# Patient Record
Sex: Male | Born: 1970 | Hispanic: Yes | Marital: Single | Smoking: Never smoker
Health system: Southern US, Community
[De-identification: ages and names within clinical notes are randomized; demographics above are authoritative.]

## PROBLEM LIST (undated history)

## (undated) DIAGNOSIS — F419 Anxiety disorder, unspecified: Secondary | ICD-10-CM

## (undated) HISTORY — PX: ABDOMINAL SURGERY: SHX537

---

## 2010-01-06 ENCOUNTER — Ambulatory Visit: Payer: Self-pay | Admitting: Family Medicine

## 2013-07-06 ENCOUNTER — Ambulatory Visit: Payer: Self-pay

## 2013-08-03 ENCOUNTER — Encounter: Payer: Self-pay | Admitting: *Deleted

## 2013-08-24 ENCOUNTER — Ambulatory Visit: Payer: Self-pay | Admitting: General Surgery

## 2013-09-07 ENCOUNTER — Encounter: Payer: Self-pay | Admitting: *Deleted

## 2013-11-13 ENCOUNTER — Emergency Department: Payer: Self-pay | Admitting: Emergency Medicine

## 2013-11-13 LAB — LIPASE, BLOOD: LIPASE: 313 U/L (ref 73–393)

## 2013-11-13 LAB — CBC WITH DIFFERENTIAL/PLATELET
BASOS ABS: 0.1 10*3/uL (ref 0.0–0.1)
BASOS PCT: 1 %
Eosinophil #: 0.2 10*3/uL (ref 0.0–0.7)
Eosinophil %: 3.4 %
HCT: 45.9 % (ref 40.0–52.0)
HGB: 15.6 g/dL (ref 13.0–18.0)
LYMPHS PCT: 17 %
Lymphocyte #: 1.2 10*3/uL (ref 1.0–3.6)
MCH: 34.2 pg — ABNORMAL HIGH (ref 26.0–34.0)
MCHC: 34.1 g/dL (ref 32.0–36.0)
MCV: 101 fL — AB (ref 80–100)
MONO ABS: 0.9 x10 3/mm (ref 0.2–1.0)
MONOS PCT: 12.1 %
Neutrophil #: 4.8 10*3/uL (ref 1.4–6.5)
Neutrophil %: 66.5 %
Platelet: 116 10*3/uL — ABNORMAL LOW (ref 150–440)
RBC: 4.57 10*6/uL (ref 4.40–5.90)
RDW: 13.2 % (ref 11.5–14.5)
WBC: 7.2 10*3/uL (ref 3.8–10.6)

## 2013-11-13 LAB — COMPREHENSIVE METABOLIC PANEL
ANION GAP: 7 (ref 7–16)
AST: 135 U/L — AB (ref 15–37)
Albumin: 3.6 g/dL (ref 3.4–5.0)
Alkaline Phosphatase: 97 U/L
BUN: 6 mg/dL — ABNORMAL LOW (ref 7–18)
Bilirubin,Total: 2.1 mg/dL — ABNORMAL HIGH (ref 0.2–1.0)
CHLORIDE: 104 mmol/L (ref 98–107)
Calcium, Total: 8.4 mg/dL — ABNORMAL LOW (ref 8.5–10.1)
Co2: 26 mmol/L (ref 21–32)
Creatinine: 0.64 mg/dL (ref 0.60–1.30)
EGFR (African American): >60
EGFR (Non-African Amer.): >60
Glucose: 164 mg/dL — ABNORMAL HIGH (ref 65–99)
Osmolality: 275 (ref 275–301)
Potassium: 3.5 mmol/L (ref 3.5–5.1)
SGPT (ALT): 80 U/L — ABNORMAL HIGH (ref 12–78)
SODIUM: 137 mmol/L (ref 136–145)
Total Protein: 8.1 g/dL (ref 6.4–8.2)

## 2013-11-13 LAB — TROPONIN I: Troponin-I: <0.02 ng/mL

## 2013-11-14 LAB — TROPONIN I

## 2016-01-28 ENCOUNTER — Emergency Department
Admission: EM | Admit: 2016-01-28 | Discharge: 2016-01-28 | Disposition: A | Discharge status: Home / Self Care | Payer: Self-pay | Attending: Emergency Medicine | Admitting: Emergency Medicine

## 2016-01-28 ENCOUNTER — Emergency Department: Payer: Self-pay

## 2016-01-28 ENCOUNTER — Encounter: Payer: Self-pay | Admitting: Emergency Medicine

## 2016-01-28 ENCOUNTER — Ambulatory Visit
Admission: EM | Admit: 2016-01-28 | Discharge: 2016-01-28 | Disposition: A | Discharge status: Other Acute Inpatient Hospital | Payer: Self-pay | Attending: Family Medicine | Admitting: Family Medicine

## 2016-01-28 DIAGNOSIS — F1010 Alcohol abuse, uncomplicated: Secondary | ICD-10-CM | POA: Insufficient documentation

## 2016-01-28 DIAGNOSIS — R202 Paresthesia of skin: Secondary | ICD-10-CM | POA: Insufficient documentation

## 2016-01-28 DIAGNOSIS — R208 Other disturbances of skin sensation: Secondary | ICD-10-CM

## 2016-01-28 DIAGNOSIS — R200 Anesthesia of skin: Secondary | ICD-10-CM

## 2016-01-28 DIAGNOSIS — Z789 Other specified health status: Secondary | ICD-10-CM

## 2016-01-28 DIAGNOSIS — Z7289 Other problems related to lifestyle: Secondary | ICD-10-CM

## 2016-01-28 HISTORY — DX: Anxiety disorder, unspecified: F41.9

## 2016-01-28 LAB — COMPREHENSIVE METABOLIC PANEL
ALT: 44 U/L (ref 17–63)
AST: 183 U/L — AB (ref 15–41)
Albumin: 2.7 g/dL — ABNORMAL LOW (ref 3.5–5.0)
Alkaline Phosphatase: 114 U/L (ref 38–126)
Anion gap: 8 (ref 5–15)
BUN: 8 mg/dL (ref 6–20)
CHLORIDE: 105 mmol/L (ref 101–111)
CO2: 25 mmol/L (ref 22–32)
CREATININE: 0.59 mg/dL — AB (ref 0.61–1.24)
Calcium: 8.1 mg/dL — ABNORMAL LOW (ref 8.9–10.3)
GFR calc non Af Amer: >60 mL/min (ref >60)
Glucose, Bld: 107 mg/dL — ABNORMAL HIGH (ref 65–99)
Potassium: 3.8 mmol/L (ref 3.5–5.1)
SODIUM: 138 mmol/L (ref 135–145)
Total Bilirubin: 5.1 mg/dL — ABNORMAL HIGH (ref 0.3–1.2)
Total Protein: 7.6 g/dL (ref 6.5–8.1)

## 2016-01-28 LAB — CBC
HCT: 38.8 % — ABNORMAL LOW (ref 40.0–52.0)
Hemoglobin: 13.5 g/dL (ref 13.0–18.0)
MCH: 36 pg — AB (ref 26.0–34.0)
MCHC: 34.8 g/dL (ref 32.0–36.0)
MCV: 103.3 fL — ABNORMAL HIGH (ref 80.0–100.0)
PLATELETS: 74 10*3/uL — AB (ref 150–440)
RBC: 3.75 MIL/uL — AB (ref 4.40–5.90)
RDW: 15.1 % — ABNORMAL HIGH (ref 11.5–14.5)
WBC: 4.6 10*3/uL (ref 3.8–10.6)

## 2016-01-28 LAB — LIPASE, BLOOD: Lipase: 51 U/L (ref 11–51)

## 2016-01-28 LAB — BILIRUBIN, DIRECT: Bilirubin, Direct: 2.2 mg/dL — ABNORMAL HIGH (ref 0.1–0.5)

## 2016-01-28 LAB — TROPONIN I

## 2016-01-28 MED ORDER — ASPIRIN 81 MG PO CHEW
324.0000 mg | CHEWABLE_TABLET | Freq: Once | ORAL | Status: AC
  Administered 2016-01-28: 324 mg via ORAL

## 2016-01-28 MED ORDER — SODIUM CHLORIDE 0.9 % IV BOLUS (SEPSIS)
1000.0000 mL | Freq: Once | INTRAVENOUS | Status: AC
  Administered 2016-01-28: 1000 mL via INTRAVENOUS

## 2016-01-28 NOTE — ED Provider Notes (Signed)
CSN: [id number]     Arrival date & time 01/28/16  0935 History   First MD Initiated Contact with Patient 01/28/16 [phone]     Chief Complaint  Patient presents with  . Numbness    left side of face and arm    (Consider location/radiation/quality/duration/timing/severity/associated sxs/prior Treatment) HPI: Patient presents today with symptoms of left-sided facial numbness and left arm numbness and weakness. Patient states that his symptoms started approximately 3 hours ago. He felt generalized weakness yesterday after cutting the lawn. He denies any headache, chest pain or shortness of breath or diaphoresis. Patient is to some vision changes that he has noticed over the last 2 months. He denies any history of hypertension, MI, CVA/TIA. He denies taking any medications recently. He does state that he did use marijuana yesterday. He denies any other drug use. He denies smoking history.  No past medical history on file. Past Surgical History  Procedure Laterality Date  . Abdominal surgery     No family history on file. Social History  Substance Use Topics  . Smoking status: Never Smoker   . Smokeless tobacco: Never Used  . Alcohol Use: Yes    Review of Systems: Negative except mentioned above.   Allergies  Review of patient's allergies indicates no known allergies.  Home Medications   Prior to Admission medications   Not on File   Meds Ordered and Administered this Visit   Medications  aspirin chewable tablet 324 mg (324 mg Oral Given 01/28/16 0954)    BP 147/85 mmHg  Pulse 91  Temp(Src) 98 F (36.7 C) (Oral)  Resp 18  Ht 5\' 6"  (1.676 m)  Wt 188 lb (85.276 kg)  BMI 30.36 kg/m2  SpO2 98% No data found.   Physical Exam   GENERAL: NAD HEENT: no pharyngeal erythema, no exudate, no erythema of TMs, no cervical LAD RESP: CTA B CARD: RRR NEURO: Patient admits to decreased sensation on the left side of the face, no obvious facial droop or tongue deviation or slurred speech,  no weakness appreciated with extremities   ED Course  Procedures (including critical care time)  Labs Review Labs Reviewed - No data to display  Imaging Review No results found.    MDM   1. Left facial numbness   EKG shows normal sinus rhythm, no ST elevations or depressions noted, normal P waves. Patient given 4 baby aspirin in the office. Will send patient to ER for further evaluation and treatment by EMS.     Jolene ProvostKirtida Kiela Shisler, MD 01/28/16 1002

## 2016-01-28 NOTE — ED Provider Notes (Signed)
[facility] Emergency Department Provider Note  Time seen: 11:15 AM  I have reviewed the triage vital signs and the nursing notes.   HISTORY  Chief Complaint Weakness and Numbness    HPI Daniel Walton is a 45 y.o. male with no past medical history presents to the emergency department with generalized weakness and left facial tingling. According to the patient he was doing a lot of yard work yesterday, states after doing yard work he had a generalized feeling of fatigue and weakness. Today he states the weakness has continued and he felt a tingling sensation to his left face with vomiting times one and so he went to urgent care for evaluation. Contrary to the urgent care note the patient denies any weakness, numbness or tingling of any arm or leg, confusion or slurred speech. Patient states just a tingling sensation to his left temple and left cheek. Denies any fever. Denies abdominal pain or chest pain.Patient continues to feel a slight tingling sensation to his left face is his only medical complaint.     Past Medical History  Diagnosis Date  . Anxiety     There are no active problems to display for this patient.   Past Surgical History  Procedure Laterality Date  . Abdominal surgery      No current outpatient prescriptions on file.  Allergies Review of patient's allergies indicates no known allergies.  No family history on file.  Social History Social History  Substance Use Topics  . Smoking status: Never Smoker   . Smokeless tobacco: Never Used  . Alcohol Use: Yes    Review of Systems Constitutional: Negative for fever Cardiovascular: Negative for chest pain. Respiratory: Negative for shortness of breath. Gastrointestinal: Negative for abdominal pain Musculoskeletal: Negative for back pain. Neurological: Negative for . Denies focal weakness. States the tingling sensation to left face. 10-point ROS otherwise  negative.  ____________________________________________   PHYSICAL EXAM:  VITAL SIGNS: ED Triage Vitals  Enc Vitals Group     BP 01/28/16 1033 148/89 mmHg     Pulse Rate 01/28/16 1033 85     Resp 01/28/16 1033 18     Temp 01/28/16 1033 98.4 F (36.9 C)     Temp Source 01/28/16 1033 Oral     SpO2 01/28/16 1033 100 %     Weight 01/28/16 1033 188 lb (85.276 kg)     Height 01/28/16 1033 5\' 6"  (1.676 m)     Head Cir --      Peak Flow --      Pain Score 01/28/16 1034 0     Pain Loc --      Pain Edu? --      Excl. in GC? --     Constitutional: Alert and oriented. Well appearing and in no distress. Eyes: Appears to have mild scleral icterus. ENT   Head: Normocephalic and atraumatic.   Mouth/Throat: Mucous membranes are moist. Cardiovascular: Normal rate, regular rhythm. No murmur Respiratory: Normal respiratory effort without tachypnea nor retractions. Breath sounds are clear Gastrointestinal: Soft and nontender. No distention. Musculoskeletal: Nontender with normal range of motion in all extremities. No lower extremity tenderness or edema. Neurologic:  Normal speech and language. No gross focal neurologic deficits. Equal grip strengths bilaterally. 5/5 motor in all extremities. Sensation intact and equal in all extremities. No pronator drift. No cranial nerve deficits. Skin:  Skin is warm, dry and intact.  Psychiatric: Mood and affect are normal. Speech and behavior are normal.   ____________________________________________  EKG  EKG reviewed and interpreted by myself shows normal sinus rhythm at 80 bpm, narrow QRS, normal axis, normal intervals, no ST changes. Overall normal EKG.  ____________________________________________    RADIOLOGY  CT negative.  ____________________________________________    INITIAL IMPRESSION / ASSESSMENT AND PLAN / ED COURSE  Pertinent labs & imaging results that were available during my care of the patient were reviewed by me  and considered in my medical decision making (see chart for details).  Patient presents with symptoms of generalized weakness, vomiting 1 at home earlier today. Has a sensation of tingling to his left face. Neurologic exam is normal. Patient does appear to have mild scleral icterus on exam. We will check labs including LFTs. Patient denies any medical history.  CT the head is negative. Patient's labs show an elevated bilirubin level of 5.1. I discussed this with the patient given his AST elevation as well, consistent with alcoholic hepatitis, the patient states significant alcohol use daily. I discussed with the patient the need to decrease and stop his alcohol consumption as well as following up with her primary care physician who can monitor his liver function over the next 6 months. Patient is agreeable to this plan. I discussed strict return precautions for any weakness, numbness, confusion or slurred speech. Currently the patient appears well, has no complaints at this time with a normal neurologic exam and otherwise negative workup.  ____________________________________________   FINAL CLINICAL IMPRESSION(S) / ED DIAGNOSES  Generalized weakness Paresthesia Hyperbilirubinemia Alcohol abuse  Minna AntisKevin Zakyria Metzinger, MD 01/28/16 1307

## 2016-01-28 NOTE — ED Notes (Signed)
EMS called to transport patient to [facility] ED 

## 2016-01-28 NOTE — ED Notes (Signed)
Patient complaining of left sided numbness and weakness since last night. Equal strength in hands.

## 2016-01-28 NOTE — Discharge Instructions (Signed)
Jaundice, Adult Jaundice is a yellowish discoloration of the skin, the whites of the eyes, and mucous membranes. Jaundice can be a sign that the liver or the bile system is not working normally. CAUSES This condition is caused by an increased level of bilirubin in the blood. Bilirubin is a substance that is produced by the normal breakdown of red blood cells. Conditions and activities that can cause an increase in the bilirubin level include:  Viral hepatitis.  Gallstones or other conditions, such as a tumor, that can cause a blockage of bile ducts.  Excessive use of alcohol.  Other liver diseases, such as cirrhosis.  Certain cancers.  Certain infections.  Certain genetic syndromes.  Certain medicines. SYMPTOMS Symptoms of this condition include:  Yellow color to the skin, the whites of the eyes, or mucous membranes.  Dark brown urine.  Stomach pain.  Light or clay-colored stool.  Itchy skin (pruritus). DIAGNOSIS This condition is diagnosed with a medical history, physical exam, and blood tests. You may have additional tests to determine what is causing your bilirubin level to increase. TREATMENT Treatment for jaundice depends on the underlying condition. Treatment may include:  Stopping the use of a certain medicine.  Fluids that are given through an IV tube that is inserted into one of your veins.  Medicines to treat pruritus.  Surgery, if there is blockage of the bile ducts. HOME CARE INSTRUCTIONS  Drink enough fluid to keep your urine clear or pale yellow.  Do not drink alcohol.  Take medicines only as directed by your health care provider.  Keep all follow-up visits as directed by your health care provider. This is important.  You may use skin lotion to relieve itching. SEEK MEDICAL CARE IF:  You have a fever. SEEK IMMEDIATE MEDICAL CARE IF:  Your symptoms suddenly get worse.  You have symptoms for more than 72 hours.  Your pain gets worse.  You  vomit repeatedly.  You become weak or confused.  You develop a severe headache.  You become severely dehydrated. Signs of severe dehydration include:  A very dry mouth.  A rapid, weak pulse.  Rapid breathing.  Blue lips.   This information is not intended to replace advice given to you by your health care provider. Make sure you discuss any questions you have with your health care provider.   Document Released: 07/13/2005 Document Revised: 11/27/2014 Document Reviewed: 07/09/2014 Elsevier Interactive Patient Education 2016 Elsevier Inc.  Paresthesia Paresthesia is a burning or prickling feeling. This feeling can happen in any part of the body. It often happens in the hands, arms, legs, or feet. Usually, it is not painful. In most cases, the feeling goes away in a short time and is not a sign of a serious problem. HOME CARE  Avoid drinking alcohol.  Try massage or needle therapy (acupuncture) to help with your problems.  Keep all follow-up visits as told by your doctor. This is important. GET HELP IF:  You keep on having episodes of paresthesia.  Your burning or prickling feeling gets worse when you walk.  You have pain or cramps.  You feel dizzy.  You have a rash. GET HELP RIGHT AWAY IF:  You feel weak.  You have trouble walking or moving.  You have problems speaking, understanding, or seeing.  You feel confused.  You cannot control when you pee (urinate) or poop (bowel movement).  You lose feeling (numbness) after an injury.  You pass out (faint).   This information is not  intended to replace advice given to you by your health care provider. Make sure you discuss any questions you have with your health care provider.   Document Released: 06/25/2008 Document Revised: 11/27/2014 Document Reviewed: 07/09/2014 Elsevier Interactive Patient Education [employer].

## 2016-01-28 NOTE — ED Notes (Signed)
[facility]. Pt reports working out in the yard yesterday and feeling very weak. Pt went to urgent care this morning because he continued to feel weak, experienced numbness and tingling on the left side of his face and vomited once. EMS reports negative stroke screen, 146/86 and CBG 104.

## 2016-04-22 ENCOUNTER — Ambulatory Visit: Payer: Self-pay | Admitting: Family Medicine

## 2016-11-17 ENCOUNTER — Ambulatory Visit
Admission: EM | Admit: 2016-11-17 | Discharge: 2016-11-17 | Disposition: A | Discharge status: Home / Self Care | Payer: Self-pay | Attending: Registered Nurse | Admitting: Registered Nurse

## 2016-11-17 DIAGNOSIS — K920 Hematemesis: Secondary | ICD-10-CM

## 2016-11-17 DIAGNOSIS — R112 Nausea with vomiting, unspecified: Secondary | ICD-10-CM

## 2016-11-17 DIAGNOSIS — R1084 Generalized abdominal pain: Secondary | ICD-10-CM

## 2016-11-17 LAB — OCCULT BLOOD X 1 CARD TO LAB, STOOL: FECAL OCCULT BLD: POSITIVE — AB

## 2016-11-17 MED ORDER — ONDANSETRON 4 MG PO TBDP
4.0000 mg | ORAL_TABLET | Freq: Once | ORAL | Status: AC
  Administered 2016-11-17: 4 mg via ORAL
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
## 2016-11-17 NOTE — ED Triage Notes (Signed)
Patient complains of severe abdominal pain. Patient states that pain started at 5pm today. Patient states that he has vomited and 2 large bowel movements. Patient states that pain starts in lower abdomen and makes a large circle around abdomen.

## 2018-11-18 ENCOUNTER — Emergency Department: Payer: Self-pay

## 2018-11-18 ENCOUNTER — Inpatient Hospital Stay: Payer: Self-pay

## 2018-11-18 ENCOUNTER — Inpatient Hospital Stay
Admission: EM | Admit: 2018-11-18 | Discharge: 2018-11-19 | DRG: 897 | Disposition: A | Discharge status: Home / Self Care | Payer: Self-pay | Attending: Internal Medicine | Admitting: Internal Medicine

## 2018-11-18 ENCOUNTER — Other Ambulatory Visit: Payer: Self-pay

## 2018-11-18 ENCOUNTER — Emergency Department (HOSPITAL_COMMUNITY): Payer: Self-pay | Attending: Emergency Medicine

## 2018-11-18 ENCOUNTER — Encounter: Payer: Self-pay | Admitting: Emergency Medicine

## 2018-11-18 DIAGNOSIS — R569 Unspecified convulsions: Secondary | ICD-10-CM

## 2018-11-18 DIAGNOSIS — F10939 Alcohol use, unspecified with withdrawal, unspecified: Secondary | ICD-10-CM

## 2018-11-18 DIAGNOSIS — F10239 Alcohol dependence with withdrawal, unspecified: Principal | ICD-10-CM | POA: Diagnosis present

## 2018-11-18 DIAGNOSIS — G4089 Other seizures: Secondary | ICD-10-CM | POA: Diagnosis present

## 2018-11-18 DIAGNOSIS — E7220 Disorder of urea cycle metabolism, unspecified: Secondary | ICD-10-CM | POA: Diagnosis present

## 2018-11-18 DIAGNOSIS — R609 Edema, unspecified: Secondary | ICD-10-CM

## 2018-11-18 DIAGNOSIS — G9389 Other specified disorders of brain: Secondary | ICD-10-CM | POA: Diagnosis present

## 2018-11-18 LAB — COMPREHENSIVE METABOLIC PANEL
ALT: 29 U/L (ref 0–44)
AST: 127 U/L — ABNORMAL HIGH (ref 15–41)
Albumin: 2.2 g/dL — ABNORMAL LOW (ref 3.5–5.0)
Alkaline Phosphatase: 103 U/L (ref 38–126)
Anion gap: 10 (ref 5–15)
BUN: 10 mg/dL (ref 6–20)
CO2: 21 mmol/L — ABNORMAL LOW (ref 22–32)
Calcium: 7.7 mg/dL — ABNORMAL LOW (ref 8.9–10.3)
Chloride: 108 mmol/L (ref 98–111)
Creatinine, Ser: 0.64 mg/dL (ref 0.61–1.24)
GFR calc Af Amer: >60 mL/min (ref >60)
GFR calc non Af Amer: >60 mL/min (ref >60)
Glucose, Bld: 130 mg/dL — ABNORMAL HIGH (ref 70–99)
Potassium: 3.9 mmol/L (ref 3.5–5.1)
Sodium: 139 mmol/L (ref 135–145)
Total Bilirubin: 4.7 mg/dL — ABNORMAL HIGH (ref 0.3–1.2)
Total Protein: 6.9 g/dL (ref 6.5–8.1)

## 2018-11-18 LAB — CBC WITH DIFFERENTIAL/PLATELET
Abs Immature Granulocytes: 0.04 10*3/uL (ref 0.00–0.07)
Basophils Absolute: 0 10*3/uL (ref 0.0–0.1)
Basophils Relative: 1 %
Eosinophils Absolute: 0.2 10*3/uL (ref 0.0–0.5)
Eosinophils Relative: 6 %
HCT: 34.7 % — ABNORMAL LOW (ref 39.0–52.0)
Hemoglobin: 11.5 g/dL — ABNORMAL LOW (ref 13.0–17.0)
Immature Granulocytes: 1 %
Lymphocytes Relative: 16 %
Lymphs Abs: 0.6 10*3/uL — ABNORMAL LOW (ref 0.7–4.0)
MCH: 35 pg — ABNORMAL HIGH (ref 26.0–34.0)
MCHC: 33.1 g/dL (ref 30.0–36.0)
MCV: 105.5 fL — ABNORMAL HIGH (ref 80.0–100.0)
Monocytes Absolute: 0.4 10*3/uL (ref 0.1–1.0)
Monocytes Relative: 11 %
Neutro Abs: 2.5 10*3/uL (ref 1.7–7.7)
Neutrophils Relative %: 65 %
Platelets: UNDETERMINED 10*3/uL (ref 150–400)
RBC: 3.29 MIL/uL — ABNORMAL LOW (ref 4.22–5.81)
RDW: 13.6 % (ref 11.5–15.5)
WBC: 3.8 10*3/uL — ABNORMAL LOW (ref 4.0–10.5)
nRBC: 0 % (ref 0.0–0.2)

## 2018-11-18 LAB — AMMONIA: Ammonia: 99 umol/L — ABNORMAL HIGH (ref 9–35)

## 2018-11-18 LAB — URINE DRUG SCREEN, QUALITATIVE (ARMC ONLY)
Amphetamines, Ur Screen: NOT DETECTED
Barbiturates, Ur Screen: NOT DETECTED
Benzodiazepine, Ur Scrn: NOT DETECTED
Cannabinoid 50 Ng, Ur ~~LOC~~: POSITIVE — AB
Cocaine Metabolite,Ur ~~LOC~~: NOT DETECTED
MDMA (Ecstasy)Ur Screen: NOT DETECTED
Methadone Scn, Ur: NOT DETECTED
Opiate, Ur Screen: NOT DETECTED
Phencyclidine (PCP) Ur S: NOT DETECTED
Tricyclic, Ur Screen: NOT DETECTED

## 2018-11-18 LAB — ETHANOL: Alcohol, Ethyl (B): <10 mg/dL (ref <10)

## 2018-11-18 MED ORDER — LACTULOSE 10 GM/15ML PO SOLN
30.0000 g | Freq: Once | ORAL | Status: AC
  Administered 2018-11-18: 30 g via ORAL
  Filled 2018-11-18: qty 60

## 2018-11-18 MED ORDER — VITAMIN B-1 100 MG PO TABS
100.0000 mg | ORAL_TABLET | Freq: Every day | ORAL | Status: DC
  Administered 2018-11-19: 100 mg via ORAL
  Filled 2018-11-18: qty 1

## 2018-11-18 MED ORDER — LORAZEPAM 2 MG/ML IJ SOLN: 0.0000 mg | Freq: Four times a day (QID) | INTRAMUSCULAR | Status: DC

## 2018-11-18 MED ORDER — FUROSEMIDE 40 MG PO TABS
40.0000 mg | ORAL_TABLET | Freq: Two times a day (BID) | ORAL | Status: DC
  Administered 2018-11-18 – 2018-11-19 (×2): 40 mg via ORAL
  Filled 2018-11-18 (×2): qty 1

## 2018-11-18 MED ORDER — THIAMINE HCL 100 MG/ML IJ SOLN
100.0000 mg | Freq: Every day | INTRAMUSCULAR | Status: DC
  Administered 2018-11-18: 100 mg via INTRAVENOUS
  Filled 2018-11-18: qty 2

## 2018-11-18 MED ORDER — SPIRONOLACTONE 25 MG PO TABS
100.0000 mg | ORAL_TABLET | Freq: Every day | ORAL | Status: DC
  Administered 2018-11-18 – 2018-11-19 (×2): 100 mg via ORAL
  Filled 2018-11-18 (×2): qty 4
  Filled 2018-11-18: qty 1

## 2018-11-18 MED ORDER — CIPROFLOXACIN HCL 500 MG PO TABS
500.0000 mg | ORAL_TABLET | Freq: Every day | ORAL | Status: DC
  Administered 2018-11-19: 09:00:00 500 mg via ORAL
  Filled 2018-11-18: qty 1

## 2018-11-18 MED ORDER — GADOBUTROL 1 MMOL/ML IV SOLN
10.0000 mL | Freq: Once | INTRAVENOUS | Status: AC | PRN
  Administered 2018-11-18: 10 mL via INTRAVENOUS

## 2018-11-18 MED ORDER — ONDANSETRON HCL 4 MG PO TABS: 4.0000 mg | ORAL_TABLET | Freq: Four times a day (QID) | ORAL | Status: DC | PRN

## 2018-11-18 MED ORDER — DOCUSATE SODIUM 100 MG PO CAPS
100.0000 mg | ORAL_CAPSULE | Freq: Two times a day (BID) | ORAL | Status: DC
  Filled 2018-11-18 (×2): qty 1

## 2018-11-18 MED ORDER — SODIUM CHLORIDE 0.9 % IV BOLUS
1000.0000 mL | Freq: Once | INTRAVENOUS | Status: AC
  Administered 2018-11-18: 04:00:00 1000 mL via INTRAVENOUS

## 2018-11-18 MED ORDER — ONDANSETRON HCL 4 MG/2ML IJ SOLN: 4.0000 mg | Freq: Four times a day (QID) | INTRAMUSCULAR | Status: DC | PRN

## 2018-11-18 MED ORDER — ENOXAPARIN SODIUM 40 MG/0.4ML ~~LOC~~ SOLN
40.0000 mg | SUBCUTANEOUS | Status: DC
  Administered 2018-11-18: 13:00:00 40 mg via SUBCUTANEOUS
  Filled 2018-11-18: qty 0.4

## 2018-11-18 MED ORDER — M.V.I. ADULT IV INJ
INJECTION | Freq: Once | INTRAVENOUS | Status: AC
  Administered 2018-11-18: 05:00:00 via INTRAVENOUS
  Filled 2018-11-18: qty 1000

## 2018-11-18 NOTE — ED Triage Notes (Signed)
Pt presents to ED from home via EMS after his wife witnessed pt having a seizure. Pt has no hx of the same. Seizure like activity was said to last approx 1 min. EMS report pt was ambulatory upon their arrival. Pt wife informed EMS that pt does consume alcohol frequently. Pt alert and calm at this time. Answering questions without difficulty.
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
## 2018-11-18 NOTE — ED Notes (Signed)
Pt remains in imaging. Has been in imaging since this RN received report.

## 2018-11-18 NOTE — ED Notes (Signed)
[facility]  CALLED  PER  DR  VERONESE MD 

## 2018-11-18 NOTE — ED Notes (Signed)
XRAY  POWERSHARE  WITH  [facility] 
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
## 2018-11-18 NOTE — ED Notes (Signed)
Pt currently in imaging.

## 2018-11-18 NOTE — Progress Notes (Signed)
eeg completed ° °
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
## 2018-11-18 NOTE — ED Notes (Signed)
Helped to toilet by tech for bowel movement.

## 2018-11-18 NOTE — Progress Notes (Signed)
Family Meeting Note  Advance Directive:yes  Today a meeting took place with the Patient.    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  He verbalized understanding.     Patient's progosis: Unable to determine and Goals for treatment: Full Code  Wife Vernona Rieger is the healthcare POA  Additional follow-up to be provided: Hospitalist, neurology  Time spent during discussion:17MIN  Ramonita Lab, MD
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
## 2018-11-18 NOTE — Procedures (Signed)
ELECTROENCEPHALOGRAM REPORT   Patient: Daniel Walton       Room #: 128A-AA EEG No. ID: [id number] Age: 48 y.o.        Sex: male Referring Physician: Gouru Report Date:  11/18/2018        Interpreting Physician: Thana Farr  History: Daniel Walton is an 48 y.o. male with new onset seizure  Medications:  Thiamine, Cipro, Lasix, Aldactone  Conditions of Recording:  This is a 21 channel routine scalp EEG performed with bipolar and monopolar montages arranged in accordance to the international 10/20 system of electrode placement. One channel was dedicated to EKG recording.  The patient is in the awake state.  Description:  The waking background activity consists of a low voltage, symmetrical, fairly well organized, 10 Hz alpha activity, seen from the parieto-occipital and posterior temporal regions.  Low voltage fast activity, poorly organized, is seen anteriorly and is at times superimposed on more posterior regions.  A mixture of theta and alpha rhythms are seen from the central and temporal regions. The patient does not drowse or sleep. No epileptiform activity is noted.   Hyperventilation was not performed.  Intermittent photic stimulation was performed but failed to illicit any change in the tracing.    IMPRESSION: This is normal awake electroencephalogram with activation procedures. There are no focal lateralizing or epileptiform features.   Thana Farr, MD Neurology [phone] 11/18/2018, 6:03 PM

## 2018-11-19 DIAGNOSIS — F10239 Alcohol dependence with withdrawal, unspecified: Principal | ICD-10-CM

## 2018-11-19 LAB — COMPREHENSIVE METABOLIC PANEL
ALT: 30 U/L (ref 0–44)
AST: 149 U/L — ABNORMAL HIGH (ref 15–41)
Albumin: 2 g/dL — ABNORMAL LOW (ref 3.5–5.0)
Alkaline Phosphatase: 85 U/L (ref 38–126)
Anion gap: 6 (ref 5–15)
BUN: 10 mg/dL (ref 6–20)
CO2: 22 mmol/L (ref 22–32)
Calcium: 7.6 mg/dL — ABNORMAL LOW (ref 8.9–10.3)
Chloride: 110 mmol/L (ref 98–111)
Creatinine, Ser: 0.69 mg/dL (ref 0.61–1.24)
GFR calc Af Amer: >60 mL/min (ref >60)
GFR calc non Af Amer: >60 mL/min (ref >60)
Glucose, Bld: 82 mg/dL (ref 70–99)
Potassium: 3.7 mmol/L (ref 3.5–5.1)
Sodium: 138 mmol/L (ref 135–145)
Total Bilirubin: 4.8 mg/dL — ABNORMAL HIGH (ref 0.3–1.2)
Total Protein: 6.4 g/dL — ABNORMAL LOW (ref 6.5–8.1)

## 2018-11-19 LAB — CBC
HCT: 31.9 % — ABNORMAL LOW (ref 39.0–52.0)
Hemoglobin: 10.7 g/dL — ABNORMAL LOW (ref 13.0–17.0)
MCH: 35 pg — ABNORMAL HIGH (ref 26.0–34.0)
MCHC: 33.5 g/dL (ref 30.0–36.0)
MCV: 104.2 fL — ABNORMAL HIGH (ref 80.0–100.0)
Platelets: 52 10*3/uL — ABNORMAL LOW (ref 150–400)
RBC: 3.06 MIL/uL — ABNORMAL LOW (ref 4.22–5.81)
RDW: 13.9 % (ref 11.5–15.5)
WBC: 5.5 10*3/uL (ref 4.0–10.5)
nRBC: 0 % (ref 0.0–0.2)

## 2018-11-19 MED ORDER — ONE-A-DAY MENS PO TABS: 1.0000 | ORAL_TABLET | Freq: Every day | ORAL | 0 refills | Status: DC

## 2018-11-19 MED ORDER — FOLIC ACID 1 MG PO TABS: 1.0000 mg | ORAL_TABLET | Freq: Every day | ORAL | 0 refills | Status: DC

## 2018-11-19 MED ORDER — FOLIC ACID 1 MG PO TABS
1.0000 mg | ORAL_TABLET | Freq: Every day | ORAL | Status: DC
  Administered 2018-11-19: 1 mg via ORAL
  Filled 2018-11-19: qty 1

## 2018-11-19 MED ORDER — THIAMINE HCL 100 MG PO TABS: 100.0000 mg | ORAL_TABLET | Freq: Every day | ORAL | 0 refills | Status: AC

## 2018-11-19 NOTE — Plan of Care (Signed)
?  Problem: Education: ?Goal: Knowledge of General Education information will improve ?Description: Including pain rating scale, medication(s)/side effects and non-pharmacologic comfort measures ?Outcome: Progressing ?  ?Problem: Health Behavior/Discharge Planning: ?Goal: Ability to manage health-related needs will improve ?Outcome: Progressing ?  ?Problem: Clinical Measurements: ?Goal: Ability to maintain clinical measurements within normal limits will improve ?Outcome: Progressing ?Goal: Will remain free from infection ?Outcome: Progressing ?Goal: Diagnostic test results will improve ?Outcome: Progressing ?Goal: Respiratory complications will improve ?Outcome: Progressing ?Goal: Cardiovascular complication will be avoided ?Outcome: Progressing ?  ?Problem: Activity: ?Goal: Risk for activity intolerance will decrease ?Outcome: Progressing ?  ?Problem: Safety: ?Goal: Ability to remain free from injury will improve ?Outcome: Progressing ?  ?

## 2018-11-19 NOTE — Progress Notes (Signed)
Pt is being discharged home. AVS given and explained to pt. Pt verbalized understanding. Rx given.
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
## 2018-11-19 NOTE — Discharge Instructions (Signed)
Follow-up with primary care physician in 3 to 4 days Patient unable to drive, operate heavy machinery, perform activities at heights and participate in water activities until release by outpatient physician Follow-up with neurology outpatient in 3 to 6 months for repeat MRI
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
## 2018-11-22 LAB — HIV ANTIBODY (ROUTINE TESTING W REFLEX): HIV Screen 4th Generation wRfx: NONREACTIVE

## 2020-02-27 ENCOUNTER — Emergency Department: Admission: EM | Admit: 2020-02-27 | Discharge: 2020-02-27 | Discharge status: Against Medical Advice | Payer: Self-pay

## 2020-06-07 ENCOUNTER — Inpatient Hospital Stay
Admission: EM | Admit: 2020-06-07 | Discharge: 2020-06-26 | DRG: 432 | Disposition: E | Payer: Self-pay | Attending: Internal Medicine | Admitting: Internal Medicine

## 2020-06-07 ENCOUNTER — Encounter: Payer: Self-pay | Admitting: Internal Medicine

## 2020-06-07 DIAGNOSIS — Z66 Do not resuscitate: Secondary | ICD-10-CM | POA: Diagnosis not present

## 2020-06-07 DIAGNOSIS — N179 Acute kidney failure, unspecified: Secondary | ICD-10-CM | POA: Diagnosis not present

## 2020-06-07 DIAGNOSIS — W06XXXA Fall from bed, initial encounter: Secondary | ICD-10-CM | POA: Diagnosis not present

## 2020-06-07 DIAGNOSIS — J9601 Acute respiratory failure with hypoxia: Secondary | ICD-10-CM | POA: Diagnosis not present

## 2020-06-07 DIAGNOSIS — Z20822 Contact with and (suspected) exposure to covid-19: Secondary | ICD-10-CM | POA: Diagnosis present

## 2020-06-07 DIAGNOSIS — R410 Disorientation, unspecified: Secondary | ICD-10-CM

## 2020-06-07 DIAGNOSIS — Z515 Encounter for palliative care: Secondary | ICD-10-CM

## 2020-06-07 DIAGNOSIS — J9602 Acute respiratory failure with hypercapnia: Secondary | ICD-10-CM | POA: Diagnosis not present

## 2020-06-07 DIAGNOSIS — S0011XA Contusion of right eyelid and periocular area, initial encounter: Secondary | ICD-10-CM | POA: Diagnosis not present

## 2020-06-07 DIAGNOSIS — K7682 Hepatic encephalopathy: Secondary | ICD-10-CM | POA: Diagnosis present

## 2020-06-07 DIAGNOSIS — E669 Obesity, unspecified: Secondary | ICD-10-CM | POA: Diagnosis present

## 2020-06-07 DIAGNOSIS — E11649 Type 2 diabetes mellitus with hypoglycemia without coma: Secondary | ICD-10-CM | POA: Diagnosis not present

## 2020-06-07 DIAGNOSIS — K7040 Alcoholic hepatic failure without coma: Principal | ICD-10-CM | POA: Diagnosis present

## 2020-06-07 DIAGNOSIS — Z6832 Body mass index (BMI) 32.0-32.9, adult: Secondary | ICD-10-CM

## 2020-06-07 DIAGNOSIS — Z978 Presence of other specified devices: Secondary | ICD-10-CM

## 2020-06-07 DIAGNOSIS — Y92230 Patient room in hospital as the place of occurrence of the external cause: Secondary | ICD-10-CM | POA: Diagnosis not present

## 2020-06-07 DIAGNOSIS — F10239 Alcohol dependence with withdrawal, unspecified: Secondary | ICD-10-CM

## 2020-06-07 DIAGNOSIS — K766 Portal hypertension: Secondary | ICD-10-CM | POA: Diagnosis present

## 2020-06-07 DIAGNOSIS — F419 Anxiety disorder, unspecified: Secondary | ICD-10-CM | POA: Diagnosis present

## 2020-06-07 DIAGNOSIS — G928 Other toxic encephalopathy: Secondary | ICD-10-CM | POA: Diagnosis present

## 2020-06-07 DIAGNOSIS — Z9114 Patient's other noncompliance with medication regimen: Secondary | ICD-10-CM

## 2020-06-07 DIAGNOSIS — K7031 Alcoholic cirrhosis of liver with ascites: Secondary | ICD-10-CM | POA: Diagnosis present

## 2020-06-07 DIAGNOSIS — Z4659 Encounter for fitting and adjustment of other gastrointestinal appliance and device: Secondary | ICD-10-CM

## 2020-06-07 DIAGNOSIS — F1020 Alcohol dependence, uncomplicated: Secondary | ICD-10-CM | POA: Diagnosis present

## 2020-06-07 DIAGNOSIS — I8510 Secondary esophageal varices without bleeding: Secondary | ICD-10-CM | POA: Diagnosis present

## 2020-06-07 DIAGNOSIS — Z79899 Other long term (current) drug therapy: Secondary | ICD-10-CM

## 2020-06-07 DIAGNOSIS — D689 Coagulation defect, unspecified: Secondary | ICD-10-CM | POA: Diagnosis present

## 2020-06-07 DIAGNOSIS — R400 Somnolence: Secondary | ICD-10-CM

## 2020-06-07 DIAGNOSIS — D6959 Other secondary thrombocytopenia: Secondary | ICD-10-CM | POA: Diagnosis present

## 2020-06-07 DIAGNOSIS — F10231 Alcohol dependence with withdrawal delirium: Secondary | ICD-10-CM | POA: Diagnosis present

## 2020-06-07 DIAGNOSIS — K7290 Hepatic failure, unspecified without coma: Secondary | ICD-10-CM

## 2020-06-07 LAB — COMPREHENSIVE METABOLIC PANEL
ALT: 24 U/L (ref 0–44)
AST: 67 U/L — ABNORMAL HIGH (ref 15–41)
Albumin: 1.8 g/dL — ABNORMAL LOW (ref 3.5–5.0)
Alkaline Phosphatase: 132 U/L — ABNORMAL HIGH (ref 38–126)
Anion gap: 8 (ref 5–15)
BUN: 17 mg/dL (ref 6–20)
CO2: 19 mmol/L — ABNORMAL LOW (ref 22–32)
Calcium: 8.2 mg/dL — ABNORMAL LOW (ref 8.9–10.3)
Chloride: 110 mmol/L (ref 98–111)
Creatinine, Ser: 0.94 mg/dL (ref 0.61–1.24)
GFR, Estimated: >60 mL/min (ref >60)
Glucose, Bld: 121 mg/dL — ABNORMAL HIGH (ref 70–99)
Potassium: 4.7 mmol/L (ref 3.5–5.1)
Sodium: 137 mmol/L (ref 135–145)
Total Bilirubin: 3.6 mg/dL — ABNORMAL HIGH (ref 0.3–1.2)
Total Protein: 7.1 g/dL (ref 6.5–8.1)

## 2020-06-07 LAB — CBC WITH DIFFERENTIAL/PLATELET
Abs Immature Granulocytes: 0.02 10*3/uL (ref 0.00–0.07)
Basophils Absolute: 0 10*3/uL (ref 0.0–0.1)
Basophils Relative: 1 %
Eosinophils Absolute: 0.2 10*3/uL (ref 0.0–0.5)
Eosinophils Relative: 4 %
HCT: 26.7 % — ABNORMAL LOW (ref 39.0–52.0)
Hemoglobin: 9.1 g/dL — ABNORMAL LOW (ref 13.0–17.0)
Immature Granulocytes: 0 %
Lymphocytes Relative: 24 %
Lymphs Abs: 1.1 10*3/uL (ref 0.7–4.0)
MCH: 33 pg (ref 26.0–34.0)
MCHC: 34.1 g/dL (ref 30.0–36.0)
MCV: 96.7 fL (ref 80.0–100.0)
Monocytes Absolute: 0.6 10*3/uL (ref 0.1–1.0)
Monocytes Relative: 14 %
Neutro Abs: 2.6 10*3/uL (ref 1.7–7.7)
Neutrophils Relative %: 57 %
Platelets: 111 10*3/uL — ABNORMAL LOW (ref 150–400)
RBC: 2.76 MIL/uL — ABNORMAL LOW (ref 4.22–5.81)
RDW: 19.1 % — ABNORMAL HIGH (ref 11.5–15.5)
WBC: 4.5 10*3/uL (ref 4.0–10.5)
nRBC: 0 % (ref 0.0–0.2)

## 2020-06-07 LAB — URINE DRUG SCREEN, QUALITATIVE (ARMC ONLY)
Amphetamines, Ur Screen: NOT DETECTED
Barbiturates, Ur Screen: NOT DETECTED
Benzodiazepine, Ur Scrn: NOT DETECTED
Cannabinoid 50 Ng, Ur ~~LOC~~: NOT DETECTED
Cocaine Metabolite,Ur ~~LOC~~: NOT DETECTED
MDMA (Ecstasy)Ur Screen: NOT DETECTED
Methadone Scn, Ur: NOT DETECTED
Opiate, Ur Screen: NOT DETECTED
Phencyclidine (PCP) Ur S: NOT DETECTED
Tricyclic, Ur Screen: NOT DETECTED

## 2020-06-07 LAB — AMMONIA: Ammonia: 108 umol/L — ABNORMAL HIGH (ref 9–35)

## 2020-06-07 LAB — GLUCOSE, CAPILLARY
Glucose-Capillary: 76 mg/dL (ref 70–99)
Glucose-Capillary: 86 mg/dL (ref 70–99)

## 2020-06-07 LAB — URINALYSIS, COMPLETE (UACMP) WITH MICROSCOPIC
Bilirubin Urine: NEGATIVE
Glucose, UA: NEGATIVE mg/dL
Ketones, ur: NEGATIVE mg/dL
Leukocytes,Ua: NEGATIVE
Nitrite: NEGATIVE
Protein, ur: NEGATIVE mg/dL
Specific Gravity, Urine: 1.011 (ref 1.005–1.030)
pH: 9 — ABNORMAL HIGH (ref 5.0–8.0)

## 2020-06-07 LAB — ETHANOL: Alcohol, Ethyl (B): <10 mg/dL (ref <10)

## 2020-06-07 LAB — LACTIC ACID, PLASMA: Lactic Acid, Venous: 1.4 mmol/L (ref 0.5–1.9)

## 2020-06-07 LAB — RESPIRATORY PANEL BY RT PCR (FLU A&B, COVID)
Influenza A by PCR: NEGATIVE
Influenza B by PCR: NEGATIVE
SARS Coronavirus 2 by RT PCR: NEGATIVE

## 2020-06-07 LAB — PHOSPHORUS: Phosphorus: 4.1 mg/dL (ref 2.5–4.6)

## 2020-06-07 LAB — MAGNESIUM: Magnesium: 1.8 mg/dL (ref 1.7–2.4)

## 2020-06-07 LAB — CBG MONITORING, ED
Glucose-Capillary: 104 mg/dL — ABNORMAL HIGH (ref 70–99)
Glucose-Capillary: 100 mg/dL — ABNORMAL HIGH (ref 70–99)

## 2020-06-07 MED ORDER — SPIRONOLACTONE 25 MG PO TABS
100.0000 mg | ORAL_TABLET | Freq: Every day | ORAL | Status: DC
  Administered 2020-06-09 – 2020-06-10 (×2): 100 mg via ORAL
  Filled 2020-06-07: qty 4
  Filled 2020-06-07: qty 1
  Filled 2020-06-07 (×2): qty 4

## 2020-06-07 MED ORDER — LORAZEPAM 2 MG/ML IJ SOLN
1.0000 mg | INTRAMUSCULAR | Status: DC | PRN
  Administered 2020-06-07 – 2020-06-08 (×7): 2 mg via INTRAVENOUS
  Administered 2020-06-09: 3 mg via INTRAVENOUS
  Administered 2020-06-10 (×2): 4 mg via INTRAVENOUS
  Filled 2020-06-07 (×2): qty 1
  Filled 2020-06-07 (×2): qty 2
  Filled 2020-06-07: qty 1
  Filled 2020-06-07: qty 2
  Filled 2020-06-07 (×4): qty 1

## 2020-06-07 MED ORDER — FUROSEMIDE 40 MG PO TABS
40.0000 mg | ORAL_TABLET | Freq: Two times a day (BID) | ORAL | Status: DC
  Filled 2020-06-07: qty 1

## 2020-06-07 MED ORDER — LACTULOSE 10 GM/15ML PO SOLN
30.0000 g | Freq: Three times a day (TID) | ORAL | Status: DC
  Administered 2020-06-07: 30 g via ORAL
  Filled 2020-06-07: qty 60

## 2020-06-07 MED ORDER — THIAMINE HCL 100 MG/ML IJ SOLN
Freq: Once | INTRAVENOUS | Status: AC
  Filled 2020-06-07: qty 1000

## 2020-06-07 MED ORDER — THIAMINE HCL 100 MG PO TABS
100.0000 mg | ORAL_TABLET | Freq: Every day | ORAL | Status: DC
  Administered 2020-06-09 – 2020-06-11 (×3): 100 mg via ORAL
  Filled 2020-06-07 (×4): qty 1

## 2020-06-07 MED ORDER — LACTULOSE 10 GM/15ML PO SOLN
30.0000 g | Freq: Once | ORAL | Status: AC
  Administered 2020-06-07: 30 g via ORAL
  Filled 2020-06-07: qty 60

## 2020-06-07 MED ORDER — ADULT MULTIVITAMIN W/MINERALS CH
1.0000 | ORAL_TABLET | Freq: Every day | ORAL | Status: DC
  Administered 2020-06-09 – 2020-06-11 (×3): 1 via ORAL
  Filled 2020-06-07 (×4): qty 1

## 2020-06-07 MED ORDER — ONE-A-DAY MENS PO TABS: 1.0000 | ORAL_TABLET | Freq: Every day | ORAL | Status: DC

## 2020-06-07 MED ORDER — THIAMINE HCL 100 MG PO TABS: 100.0000 mg | ORAL_TABLET | Freq: Every day | ORAL | Status: DC

## 2020-06-07 MED ORDER — RIFAXIMIN 200 MG PO TABS
200.0000 mg | ORAL_TABLET | Freq: Two times a day (BID) | ORAL | Status: DC
  Administered 2020-06-09 – 2020-06-10 (×2): 200 mg via ORAL
  Filled 2020-06-07 (×8): qty 1

## 2020-06-07 MED ORDER — FOLIC ACID 1 MG PO TABS: 1.0000 mg | ORAL_TABLET | Freq: Every day | ORAL | Status: DC

## 2020-06-07 MED ORDER — LORAZEPAM 2 MG/ML IJ SOLN
1.0000 mg | Freq: Once | INTRAMUSCULAR | Status: AC
  Administered 2020-06-07: 1 mg via INTRAVENOUS
  Filled 2020-06-07: qty 1

## 2020-06-07 MED ORDER — ONDANSETRON HCL 4 MG/2ML IJ SOLN: 4.0000 mg | Freq: Four times a day (QID) | INTRAMUSCULAR | Status: DC | PRN

## 2020-06-07 MED ORDER — LACTULOSE ENEMA
300.0000 mL | Freq: Two times a day (BID) | ORAL | Status: DC
  Administered 2020-06-08: 300 mL via RECTAL
  Filled 2020-06-07 (×6): qty 300

## 2020-06-07 MED ORDER — FOLIC ACID 1 MG PO TABS
1.0000 mg | ORAL_TABLET | Freq: Every day | ORAL | Status: DC
  Administered 2020-06-09 – 2020-06-11 (×3): 1 mg via ORAL
  Filled 2020-06-07 (×4): qty 1

## 2020-06-07 MED ORDER — LORAZEPAM 1 MG PO TABS
1.0000 mg | ORAL_TABLET | ORAL | Status: DC | PRN
  Administered 2020-06-09: 4 mg via ORAL
  Filled 2020-06-07: qty 4

## 2020-06-07 MED ORDER — HALOPERIDOL LACTATE 5 MG/ML IJ SOLN
3.0000 mg | Freq: Once | INTRAMUSCULAR | Status: AC
  Administered 2020-06-07: 3 mg via INTRAVENOUS

## 2020-06-07 MED ORDER — SODIUM CHLORIDE 0.9 % IV SOLN
2.0000 g | INTRAVENOUS | Status: DC
  Administered 2020-06-07 – 2020-06-08 (×2): 2 g via INTRAVENOUS
  Filled 2020-06-07: qty 20
  Filled 2020-06-07: qty 2

## 2020-06-07 MED ORDER — HALOPERIDOL LACTATE 5 MG/ML IJ SOLN
2.0000 mg | Freq: Once | INTRAMUSCULAR | Status: AC
  Administered 2020-06-07: 2 mg via INTRAVENOUS
  Filled 2020-06-07: qty 1

## 2020-06-07 MED ORDER — ONDANSETRON HCL 4 MG/2ML IJ SOLN
4.0000 mg | Freq: Once | INTRAMUSCULAR | Status: AC
  Administered 2020-06-07: 4 mg via INTRAVENOUS
  Filled 2020-06-07: qty 2

## 2020-06-07 MED ORDER — ONDANSETRON HCL 4 MG PO TABS: 4.0000 mg | ORAL_TABLET | Freq: Four times a day (QID) | ORAL | Status: DC | PRN

## 2020-06-07 MED ORDER — ENOXAPARIN SODIUM 60 MG/0.6ML ~~LOC~~ SOLN
0.5000 mg/kg | SUBCUTANEOUS | Status: DC
  Administered 2020-06-07 – 2020-06-12 (×6): 50 mg via SUBCUTANEOUS
  Filled 2020-06-07 (×7): qty 0.6

## 2020-06-07 NOTE — ED Triage Notes (Signed)
Pt brought in via EMS, states pt is AMS since last night.  Pt has hx of cirrhosis.  According to family, lactulose given at 0300 and 0500.  Pt confused, unable to follow commands.  BGL 134 per EMS.

## 2020-06-07 NOTE — ED Notes (Addendum)
Pt less combative, but still attempting to get out of bed at times. Pt continues to say "please man, stop, let go man, fuck man" repeatedly. This Clinical research associate and fiance remain at bedside. Pt seems to be sleepy, but keeps waking up attempting to get up from bed. This Clinical research associate and fiance keep holding pt down to avoid pt from getting up multiple times; pt keeps kicking legs over rails. Pt having to be redirected continuously.

## 2020-06-07 NOTE — ED Provider Notes (Signed)
[facility] Emergency Department Provider Note   ____________________________________________   First MD Initiated Contact with Patient 2020/06/12 [phone]     (approximate)  I have reviewed the triage vital signs and the nursing notes.   HISTORY  Chief Complaint Altered Mental Status (since last night)    HPI Daniel Walton is a 49 y.o. male with a past medical history of alcoholic cirrhosis who presents via EMS after family called due to patient's altered mental status that is worsening over the last 2 days.  Family attempted to give lactulose double dose over the last 2 days with no improvement in patient's mental status as he has not had any further bowel movements.  Patient is alert and oriented x0 and is unable to participate in history and review of systems.         Past Medical History:  Diagnosis Date  . Anxiety     Patient Active Problem List   Diagnosis Date Noted  . Seizures (HCC) 11/18/2018    Past Surgical History:  Procedure Laterality Date  . ABDOMINAL SURGERY      Prior to Admission medications   Medication Sig Start Date End Date Taking? Authorizing Provider  ciprofloxacin (CIPRO) 500 MG tablet Take 500 mg by mouth daily with breakfast.    [provider]  folic acid (FOLVITE) 1 MG tablet Take 1 tablet (1 mg total) by mouth daily. 11/19/18   Ramonita Lab, MD  furosemide (LASIX) 40 MG tablet Take 40 mg by mouth 2 (two) times daily.     [provider]  multivitamin (ONE-A-DAY MEN'S) TABS tablet Take 1 tablet by mouth daily. 11/19/18   Ramonita Lab, MD  spironolactone (ALDACTONE) 100 MG tablet Take 100 mg by mouth daily.     [provider]  thiamine 100 MG tablet Take 1 tablet (100 mg total) by mouth daily. 11/20/18   Ramonita Lab, MD    Allergies Patient has no known allergies.  No family history on file.  Social History Social History   Tobacco Use  . Smoking status: Never Smoker  . Smokeless  tobacco: Never Used  Vaping Use  . Vaping Use: Never used  Substance Use Topics  . Alcohol use: Yes  . Drug use: Yes    Review of Systems Unable to assess  ____________________________________________   PHYSICAL EXAM:  VITAL SIGNS: ED Triage Vitals  Enc Vitals Group     BP 12-Jun-2020 0755 (!) 176/102     Pulse --      Resp 2020-06-12 0755 20     Temp --      Temp src --      SpO2 06/12/2020 0752 100 %     Weight 06-12-2020 0756 215 lb 4.8 oz (97.7 kg)     Height June 12, 2020 0756 5\' 7"  (1.702 m)     Head Circumference --      Peak Flow --      Pain Score --      Pain Loc --      Pain Edu? --      Excl. in GC? --    Constitutional: Alert and oriented x0.  Somnolent but arousable in bed Eyes: Bilateral scleral icterus. PERRL. Head: Atraumatic. Nose: No congestion/rhinnorhea. Mouth/Throat: Mucous membranes are moist. Neck: No stridor Cardiovascular: Grossly normal heart sounds.  Good peripheral circulation. Respiratory: Normal respiratory effort.  No retractions. Gastrointestinal: Soft and nontender.  Midline hernia appreciated and fully reducible Musculoskeletal: No obvious deformities Neurologic:  Normal speech  and language. No gross focal neurologic deficits are appreciated. Skin:  Skin is warm and dry. No rash noted.  Spider telangiectasias over trunk, jaundice Psychiatric: Mood and affect are somnolent and decreased  ____________________________________________   LABS (all labs ordered are listed, but only abnormal results are displayed)  Labs Reviewed  COMPREHENSIVE METABOLIC PANEL - Abnormal; Notable for the following components:      Result Value   CO2 19 (*)    Glucose, Bld 121 (*)    Calcium 8.2 (*)    Albumin 1.8 (*)    AST 67 (*)    Alkaline Phosphatase 132 (*)    Total Bilirubin 3.6 (*)    All other components within normal limits  CBC WITH DIFFERENTIAL/PLATELET - Abnormal; Notable for the following components:   RBC 2.76 (*)    Hemoglobin 9.1 (*)     HCT 26.7 (*)    RDW 19.1 (*)    Platelets 111 (*)    All other components within normal limits  AMMONIA - Abnormal; Notable for the following components:   Ammonia 108 (*)    All other components within normal limits  CBG MONITORING, ED - Abnormal; Notable for the following components:   Glucose-Capillary 104 (*)    All other components within normal limits  RESPIRATORY PANEL BY RT PCR (FLU A&B, COVID)  LACTIC ACID, PLASMA  ETHANOL  URINALYSIS, COMPLETE (UACMP) WITH MICROSCOPIC  URINE DRUG SCREEN, QUALITATIVE ([facility] ONLY)   ____________________________________________  EKG  ED ECG REPORT I, Merwyn Katos, the attending physician, personally viewed and interpreted this ECG.  Date: 06/05/2020 EKG Time: 0812 Rate: 70 Rhythm: normal sinus rhythm QRS Axis: normal Intervals: normal ST/T Wave abnormalities: normal Narrative Interpretation: no evidence of acute ischemia   PROCEDURES  Procedure(s) performed (including Critical Care):  .1-3 Lead EKG Interpretation Performed by: Merwyn Katos, MD Authorized by: Merwyn Katos, MD     Interpretation: normal     ECG rate:  68   ECG rate assessment: normal     Rhythm: sinus rhythm     Ectopy: none     Conduction: normal   .Critical Care Performed by: Merwyn Katos, MD Authorized by: Merwyn Katos, MD   Critical care provider statement:    Critical care time (minutes):  37   Critical care time was exclusive of:  Separately billable procedures and treating other patients   Critical care was necessary to treat or prevent imminent or life-threatening deterioration of the following conditions:  Metabolic crisis and CNS failure or compromise   Critical care was time spent personally by me on the following activities:  Discussions with consultants, evaluation of patient's response to treatment, examination of patient, ordering and performing treatments and interventions, ordering and review of laboratory studies, ordering and  review of radiographic studies, pulse oximetry, re-evaluation of patient's condition, obtaining history from patient or surrogate and review of old charts   I assumed direction of critical care for this patient from another provider in my specialty: no       ____________________________________________   INITIAL IMPRESSION / ASSESSMENT AND PLAN / ED COURSE  As part of my medical decision making, I reviewed the following data within the electronic MEDICAL RECORD NUMBER Nursing notes reviewed and incorporated, Labs reviewed, Old chart reviewed and Notes from prior ED visits reviewed and incorporated       Patient is a 50 year old male with a past medical history of alcoholic cirrhosis who presents for altered mental status Given  history of cirrhosis, exam, and workup the patients disorientation is most concerning for hepatic encephalopathy. At this time I have a lower suspicion for ICH, CNS infection, severe Toxidrome, severe metabolic derangement, or stroke. ED Interventions: Lactulose 20g PO Disposition: Admit      ____________________________________________   FINAL CLINICAL IMPRESSION(S) / ED DIAGNOSES  Final diagnoses:  Somnolence  Confusion  Hepatic encephalopathy [facility])     ED Discharge Orders    None       Note:  This document was prepared using Dragon voice recognition software and may include unintentional dictation errors.   Merwyn Katos, MD June 23, 2020 0930

## 2020-06-07 NOTE — H&P (Addendum)
History and Physical    Daniel Walton CWC:[id number] DOB: Aug 18, 1970 DOA: 20-Jun-2020  PCP: [facility]   Patient coming from: Home  I have personally briefly reviewed patient's old medical records in [facility]  Chief Complaint: Change in mental status Most of the history was obtained from his fiance at the bedside. Patient unable to provide any history at this time  HPI: Daniel Walton is a 49 y.o. 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 Per his fiance patient was discharged from [facility] about 10 days ago for hepatic encephalopathy. Since his discharge patient has been noncompliant with prescribed medication and has continued to drink alcohol. She states that over the last 2 days he has become increasingly confused and in the last 24 hours very agitated and difficult to reorient  for which the police and EMS had to be called. His family had tried to double up on his dose of lactulose without any improvement in his symptoms and patient has not had a bowel movement. I am unable to do a review of systems on this patient due to his mental status changes. Labs show sodium 137, potassium 4.7, chloride 110, bicarb 19, glucose 121, BUN 17, creatinine 0.94, calcium 8.2, alkaline phosphatase 132, albumin 1.8, AST 67, ALT 24, total protein 7.1, ammonia 108, white count 4.5, hemoglobin 9.1, hematocrit 26.7, MCV 96.7, RDW 19.1, platelet count 111 Respiratory viral panel is negative Alcohol level less than 10 Twelve-lead EKG reviewed by me shows sinus rhythm   ED Course: Patient is a 49 year old male with a history of alcoholic liver cirrhosis who was brought into the ER by EMS for  evaluation of mental status changes described as confusion and agitation. Patient was noted to be very agitated in the ER and difficult to reorient and despite multiple doses of Haldol he remained agitated. His fiance who provides most of the history states that he continues to drink alcohol. Patient received lactulose in the ER and will be admitted to the hospital for further evaluation.  Review of Systems: As per HPI otherwise 10 point review of systems negative.    Past Medical History:  Diagnosis Date  . Anxiety     Past Surgical History:  Procedure Laterality Date  . ABDOMINAL SURGERY       reports that he has never smoked. He has never used smokeless tobacco. He reports current alcohol use. He reports current drug use.  No Known Allergies  Family History  Problem Relation Age of Onset  . Hypertension Mother   . Hypertension Father      Prior to Admission medications   Medication Sig Start Date End Date Taking? Authorizing Provider  ciprofloxacin (CIPRO) 500 MG tablet Take 500 mg by mouth daily with breakfast.    [provider]  folic acid (FOLVITE) 1 MG tablet Take 1 tablet (1 mg total) by mouth daily. 11/19/18   Ramonita Lab, MD  furosemide (LASIX) 40 MG tablet Take 40 mg by mouth 2 (two) times daily.     [provider]  multivitamin (ONE-A-DAY MEN'S) TABS tablet Take 1 tablet by mouth daily. 11/19/18   Ramonita Lab, MD  spironolactone (ALDACTONE) 100 MG tablet Take 100 mg by mouth daily.     [provider]  thiamine 100 MG tablet Take 1 tablet (100 mg total) by mouth daily. 11/20/18   Ramonita Lab, MD    Physical Exam: Vitals:   05/30/2020 0824 06/13/2020 0833 Jun 14, 2020 0900 06/16/2020 1000  BP: (!) 163/91  (!) 168/91 (!) 175/91  Pulse: 70  79 77  Resp: 20  (!) 26 (!) 25  Temp:  (!) 97.4 F (36.3 C)    TempSrc:  Axillary    SpO2: 100%  100% 100%  Weight:      Height:         Vitals:   Jun 14, 2020 0824 06/03/2020 0833 06/21/2020 0900 06/24/2020  1000  BP: (!) 163/91  (!) 168/91 (!) 175/91  Pulse: 70  79 77  Resp: 20  (!) 26 (!) 25  Temp:  (!) 97.4 F (36.3 C)    TempSrc:  Axillary    SpO2: 100%  100% 100%  Weight:      Height:        Constitutional: NAD, agitated, difficult to reorient Eyes: PERRL, lids and conjunctivae pallor ENMT: Mucous membranes are moist.  Neck: normal, supple, no masses, no thyromegaly Respiratory: clear to auscultation bilaterally, no wheezing, no crackles. Normal respiratory effort. No accessory muscle use.  Cardiovascular: Regular rate and rhythm, no murmurs / rubs / gallops. Lymphedema of the right leg. 2+ pedal pulses. No carotid bruits.  Abdomen: no tenderness, no masses palpated. No hepatosplenomegaly. Bowel sounds positive. Ventral hernia, Inguinal hernia Musculoskeletal: no clubbing / cyanosis. No joint deformity upper and lower extremities.  Skin: no rashes, lesions, ulcers.  Neurologic: Moves all extremities Psychiatric: Agitated   Labs on Admission: I have personally reviewed following labs and imaging studies  CBC: Recent Labs  Lab Jun 14, 2020 0808  WBC 4.5  NEUTROABS 2.6  HGB 9.1*  HCT 26.7*  MCV 96.7  PLT 111*   Basic Metabolic Panel: Recent Labs  Lab 06/09/2020 0808  NA 137  K 4.7  CL 110  CO2 19*  GLUCOSE 121*  BUN 17  CREATININE 0.94  CALCIUM 8.2*   GFR: Estimated Creatinine Clearance: 107 mL/min (by C-G formula based on SCr of 0.94 mg/dL). Liver Function Tests: Recent Labs  Lab 14-Jun-2020 0808  AST 67*  ALT 24  ALKPHOS 132*  BILITOT 3.6*  PROT 7.1  ALBUMIN 1.8*   No results for input(s): LIPASE, AMYLASE in the last 168 hours. Recent Labs  Lab 05/30/2020 0808  AMMONIA 108*   Coagulation Profile: No results for input(s): INR, PROTIME in the last 168 hours. Cardiac Enzymes: No results for input(s): CKTOTAL, CKMB, CKMBINDEX, TROPONINI in the last 168 hours. BNP (last 3 results) No results for input(s): PROBNP in the last 8760 hours. HbA1C: No results  for input(s): HGBA1C in the last 72 hours. CBG: Recent Labs  Lab 06/19/2020 0817  GLUCAP 104*   Lipid Profile: No results for input(s): CHOL, HDL, LDLCALC, TRIG, CHOLHDL, LDLDIRECT in the last 72 hours. Thyroid Function Tests: No results for input(s): TSH, T4TOTAL, FREET4, T3FREE, THYROIDAB in the last 72 hours. Anemia Panel: No results for input(s): VITAMINB12, FOLATE, FERRITIN, TIBC, IRON, RETICCTPCT in the last 72 hours. Urine analysis: No results found for: COLORURINE, APPEARANCEUR, LABSPEC, PHURINE, GLUCOSEU, HGBUR, BILIRUBINUR, KETONESUR, PROTEINUR, UROBILINOGEN, NITRITE, LEUKOCYTESUR  Radiological Exams on Admission: No results found.  EKG: Independently reviewed.  Normal sinus rhythm  Assessment/Plan Principal Problem:   Hepatic encephalopathy (HCC) Active Problems:   Alcoholic cirrhosis of liver with ascites (HCC)   Alcohol dependence (HCC)      Hepatic  encephalopathy Secondary to medication noncompliance Patient has a history of alcoholic liver cirrhosis and was recently hospitalized for hepatic encephalopathy. He has been noncompliant with prescribed lactulose We will start patient back on lactulose 30 mg 3 times daily and titrate for 2-3 stools Start patient on rifaximin Patient will need a safety sitter until his mental status improves We will keep patient n.p.o. now except for medication Start patient on Rocephin 2 g IV daily for SBP prophylaxis     Alcoholic cirrhosis of liver/Alcohol dependence  Patient has a history of alcoholic liver cirrhosis and continues to drink alcohol Patient has complications of liver cirrhosis which include portal hypertension with thrombocytopenia and grade 1 esophageal varices, ventral/inguinal hernia and ascites We will place patient on lorazepam for alcohol withdrawal symptoms and administer for CIWA score of 8 or greater Continue spironolactone, furosemide, thiamine and folic acid    DVT prophylaxis: Lovenox Code  Status: Full code Family Communication: Greater than 50% of time was spent discussing patient's condition and plan of care with his fiance at the bedside. Patient's son is his healthcare power of attorney Disposition Plan: Back to previous home environment Consults called: None    Cervando Durnin MD [facility]     06/15/2020, 10:39 AM

## 2020-06-07 NOTE — Progress Notes (Signed)
PHARMACIST - PHYSICIAN COMMUNICATION  CONCERNING:  Enoxaparin (Lovenox) for DVT Prophylaxis   RECOMMENDATION: Patient was prescribed enoxaprin 40mg  q24 hours for VTE prophylaxis.   Filed Weights   06/10/2020 0756  Weight: 97.7 kg (215 lb 4.8 oz)    Body mass index is 33.72 kg/m.  Estimated Creatinine Clearance: 107 mL/min (by C-G formula based on SCr of 0.94 mg/dL).   Based on [facility] policy patient is candidate for enoxaparin 0.5mg /kg TBW SQ every 24 hours based on BMI being >30.  DESCRIPTION: Pharmacy has adjusted enoxaparin dose per [facility] policy.  Patient is now receiving enoxaparin 50 mg every 24 hours   [facility], PharmD, BCPS Clinical Pharmacist 06/08/2020 9:58 AM

## 2020-06-07 NOTE — ED Notes (Signed)
Pt appears to be asleep, but making noise at this time. This Clinical research associate and fiance remain at bedside.

## 2020-06-07 NOTE — TOC Initial Note (Signed)
Transition of Care [facility]) - Initial/Assessment Note    Patient Details  Name: Daniel Walton MRN: [id number] Date of Birth: 1971/03/05  [facility] CM/SW Contact:    Marina Goodell Phone Number: [phone] 2020-06-13, 2:26 PM  Clinical Narrative:                  CSW attempted to assess the patient.  Patient was unable to speak due to discomfort.        Patient Goals and CMS Choice        Expected Discharge Plan and Services                                                Prior Living Arrangements/Services                       Activities of Daily Living      Permission Sought/Granted                  Emotional Assessment              Admission diagnosis:  Hepatic encephalopathy (HCC) [K72.90] Patient Active Problem List   Diagnosis Date Noted  . Hepatic encephalopathy (HCC) 06/13/20  . Alcoholic cirrhosis of liver with ascites (HCC) 2020-06-13  . Alcohol dependence (HCC) 13-Jun-2020  . Seizures (HCC) 11/18/2018   PCP:  [facility] Pharmacy:   [facility] [address] [place] - [address] [place] [address] Phone: [phone] Fax: [phone]     Social Determinants of Health (SDOH) Interventions    Readmission Risk Interventions No flowsheet data found.

## 2020-06-07 NOTE — ED Notes (Signed)
This tech as 1:1 Recruitment consultant; pt continues to try to get out of bed and becomes aggressive at times. Pts fiance at bedside helping this tech keep pt in bed. Pt continues to say "please, stop, let go, fuck man". Nurse Tammy, made aware and has been in rm to check on pt.

## 2020-06-08 ENCOUNTER — Encounter: Payer: Self-pay | Admitting: Internal Medicine

## 2020-06-08 LAB — GLUCOSE, CAPILLARY
Glucose-Capillary: 79 mg/dL (ref 70–99)
Glucose-Capillary: 86 mg/dL (ref 70–99)
Glucose-Capillary: 74 mg/dL (ref 70–99)
Glucose-Capillary: 66 mg/dL — ABNORMAL LOW (ref 70–99)
Glucose-Capillary: 105 mg/dL — ABNORMAL HIGH (ref 70–99)

## 2020-06-08 LAB — MAGNESIUM: Magnesium: 1.9 mg/dL (ref 1.7–2.4)

## 2020-06-08 LAB — CBC
HCT: 25.7 % — ABNORMAL LOW (ref 39.0–52.0)
Hemoglobin: 8.8 g/dL — ABNORMAL LOW (ref 13.0–17.0)
MCH: 33 pg (ref 26.0–34.0)
MCHC: 34.2 g/dL (ref 30.0–36.0)
MCV: 96.3 fL (ref 80.0–100.0)
Platelets: 86 10*3/uL — ABNORMAL LOW (ref 150–400)
RBC: 2.67 MIL/uL — ABNORMAL LOW (ref 4.22–5.81)
RDW: 19.3 % — ABNORMAL HIGH (ref 11.5–15.5)
WBC: 4.1 10*3/uL (ref 4.0–10.5)
nRBC: 0 % (ref 0.0–0.2)

## 2020-06-08 LAB — BASIC METABOLIC PANEL
Anion gap: 6 (ref 5–15)
BUN: 17 mg/dL (ref 6–20)
CO2: 18 mmol/L — ABNORMAL LOW (ref 22–32)
Calcium: 8.1 mg/dL — ABNORMAL LOW (ref 8.9–10.3)
Chloride: 116 mmol/L — ABNORMAL HIGH (ref 98–111)
Creatinine, Ser: 0.79 mg/dL (ref 0.61–1.24)
GFR, Estimated: >60 mL/min (ref >60)
Glucose, Bld: 86 mg/dL (ref 70–99)
Potassium: 3.9 mmol/L (ref 3.5–5.1)
Sodium: 140 mmol/L (ref 135–145)

## 2020-06-08 LAB — HIV ANTIBODY (ROUTINE TESTING W REFLEX): HIV Screen 4th Generation wRfx: NONREACTIVE

## 2020-06-08 LAB — PHOSPHORUS: Phosphorus: 4.7 mg/dL — ABNORMAL HIGH (ref 2.5–4.6)

## 2020-06-08 MED ORDER — SODIUM CHLORIDE 0.9 % IV SOLN
INTRAVENOUS | Status: DC | PRN
  Administered 2020-06-08: 250 mL via INTRAVENOUS

## 2020-06-08 MED ORDER — HALOPERIDOL LACTATE 5 MG/ML IJ SOLN
5.0000 mg | Freq: Four times a day (QID) | INTRAMUSCULAR | Status: DC | PRN
  Administered 2020-06-08 – 2020-06-10 (×4): 5 mg via INTRAMUSCULAR
  Filled 2020-06-08 (×4): qty 1

## 2020-06-08 MED ORDER — FUROSEMIDE 10 MG/ML IJ SOLN
40.0000 mg | Freq: Two times a day (BID) | INTRAMUSCULAR | Status: DC
  Administered 2020-06-08 (×2): 40 mg via INTRAVENOUS
  Filled 2020-06-08 (×2): qty 4

## 2020-06-08 MED ORDER — DEXTROSE 50 % IV SOLN
12.5000 g | INTRAVENOUS | Status: AC
  Administered 2020-06-08: 12.5 g via INTRAVENOUS
  Filled 2020-06-08: qty 50

## 2020-06-08 NOTE — Progress Notes (Addendum)
Progress Note    Daniel Walton  [id number]:[id number] DOB: 1971/04/28  DOA: 06/21/2020 PCP: [facility]      Brief Narrative:    Medical records reviewed and are as summarized below:  Daniel Walton is a 49 y.o. 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  He was recently discharged from [facility] about 10 days prior to admission after hospitalization for hepatic encephalopathy.  Since his recent discharge, he has not been taking his medicines and has continued to drink alcohol.  He was brought to the hospital because of increasing confusion and agitation.      Assessment/Plan:   Principal Problem:   Hepatic encephalopathy (HCC) Active Problems:   Alcoholic cirrhosis of liver with ascites (HCC)   Alcohol dependence (HCC)    Body mass index is 32.86 kg/m. ( obesity)   Hepatic encephalopathy: Continue lactulose and rifaximin.  Monitor ammonia level.  Continue supportive care for encephalopathy.  Alcoholic liver cirrhosis complicated by thrombocytopenia, grade 1 esophageal varices, ascites and coagulopathy: Change oral to IV Lasix for peripheral edema.  Continue Aldactone.  Discontinue IV Rocephin since there is no GI bleeding at this time.  Alcohol use disorder: Continue multivitamins.  Ativan as needed per CIWA protocol.     Diet Order            Diet NPO time specified Except for: Sips with Meds  Diet effective now                    Consultants:  None  Procedures:  None    Medications:   . enoxaparin (LOVENOX) injection  0.5 mg/kg Subcutaneous Q24H  . folic acid  1 mg Oral Daily  . furosemide  40 mg Intravenous BID  . lactulose  300 mL Rectal BID  . multivitamin with minerals  1 tablet Oral Daily  . rifaximin  200 mg Oral BID  .  spironolactone  100 mg Oral Daily  . thiamine  100 mg Oral Daily   Continuous Infusions: . cefTRIAXone (ROCEPHIN)  IV Stopped (05/27/2020 1045)     Anti-infectives (From admission, onward)   Start     Dose/Rate Route Frequency Ordered Stop   05/29/2020 1000  cefTRIAXone (ROCEPHIN) 2 g in sodium chloride 0.9 % 100 mL IVPB        2 g 200 mL/hr over 30 Minutes Intravenous Every 24 hours 06/06/2020 0945     06/05/2020 1000  rifaximin (XIFAXAN) tablet 200 mg        200 mg Oral 2 times daily 06/25/2020 0950               Family Communication/Anticipated D/C date and plan/Code Status   DVT prophylaxis:      Code Status: Full Code  Family Communication: None Disposition Plan:    Status is: Inpatient  Remains inpatient appropriate because:Altered mental status, IV treatments appropriate due to intensity of illness or inability to take PO and Inpatient level of care appropriate due to severity of illness   Dispo: The patient is from: Home              Anticipated d/c is to: Home              Anticipated d/c date is: 2 days  Patient currently is not medically stable to d/c.           Subjective:   Interval events noted.  Patient is confused and unable to provide any history.  Objective:    Vitals:   2020-07-05 1919 Jul 05, 2020 2328 06/08/20 0354 06/08/20 0732  BP: 129/77 136/89 (!) 146/96 (!) 149/84  Pulse: 71 67 82 68  Resp: 16 14 16 16   Temp: 97.7 F (36.5 C) 98 F (36.7 C) (!) 97.5 F (36.4 C) 97.8 F (36.6 C)  TempSrc: Oral Oral Oral Axillary  SpO2: 100% 100% 100% 100%  Weight:      Height:       No data found.   Intake/Output Summary (Last 24 hours) at 06/08/2020 1024 Last data filed at 2020/07/05 2200 Gross per 24 hour  Intake 100 ml  Output 450 ml  Net -350 ml   Filed Weights   07/05/20 0756 07-05-20 1444  Weight: 97.7 kg 95.2 kg    Exam:  GEN: NAD SKIN: No rash EYES: EOMI, icteric ENT: MMM CV: RRR PULM: CTA B ABD: soft,  distended, abdominal hernia, NT, +BS CNS: lethargic, non focal EXT: Significant right leg edema, mild left leg edema, no tenderness   Data Reviewed:   I have personally reviewed following labs and imaging studies:  Labs: Labs show the following:   Basic Metabolic Panel: Recent Labs  Lab 2020/07/05 0808 06/08/20 0509  NA 137 140  K 4.7 3.9  CL 110 116*  CO2 19* 18*  GLUCOSE 121* 86  BUN 17 17  CREATININE 0.94 0.79  CALCIUM 8.2* 8.1*  MG 1.8 1.9  PHOS 4.1 4.7*   GFR Estimated Creatinine Clearance: 124.1 mL/min (by C-G formula based on SCr of 0.79 mg/dL). Liver Function Tests: Recent Labs  Lab 07/05/20 0808  AST 67*  ALT 24  ALKPHOS 132*  BILITOT 3.6*  PROT 7.1  ALBUMIN 1.8*   No results for input(s): LIPASE, AMYLASE in the last 168 hours. Recent Labs  Lab 2020-07-05 0808  AMMONIA 108*   Coagulation profile No results for input(s): INR, PROTIME in the last 168 hours.  CBC: Recent Labs  Lab July 05, 2020 0808 06/08/20 0509  WBC 4.5 4.1  NEUTROABS 2.6  --   HGB 9.1* 8.8*  HCT 26.7* 25.7*  MCV 96.7 96.3  PLT 111* 86*   Cardiac Enzymes: No results for input(s): CKTOTAL, CKMB, CKMBINDEX, TROPONINI in the last 168 hours. BNP (last 3 results) No results for input(s): PROBNP in the last 8760 hours. CBG: Recent Labs  Lab 07/05/2020 1246 07-05-2020 2103 2020-07-05 2331 06/08/20 0351 06/08/20 0747  GLUCAP 100* 86 76 79 86   D-Dimer: No results for input(s): DDIMER in the last 72 hours. Hgb A1c: No results for input(s): HGBA1C in the last 72 hours. Lipid Profile: No results for input(s): CHOL, HDL, LDLCALC, TRIG, CHOLHDL, LDLDIRECT in the last 72 hours. Thyroid function studies: No results for input(s): TSH, T4TOTAL, T3FREE, THYROIDAB in the last 72 hours.  Invalid input(s): FREET3 Anemia work up: No results for input(s): VITAMINB12, FOLATE, FERRITIN, TIBC, IRON, RETICCTPCT in the last 72 hours. Sepsis Labs: Recent Labs  Lab 2020-07-05 0808 06/08/20 0509    WBC 4.5 4.1  LATICACIDVEN 1.4  --     Microbiology Recent Results (from the past 240 hour(s))  Respiratory Panel by RT PCR (Flu A&B, Covid) - Nasopharyngeal Swab     Status: None   Collection Time: 07/05/20  8:58 AM   Specimen: Nasopharyngeal Swab  Result Value  Ref Range Status   SARS Coronavirus 2 by RT PCR NEGATIVE NEGATIVE Final    Comment: (NOTE) SARS-CoV-2 target nucleic acids are NOT DETECTED.  The SARS-CoV-2 RNA is generally detectable in upper respiratoy specimens during the acute phase of infection. The lowest concentration of SARS-CoV-2 viral copies this assay can detect is 131 copies/mL. A negative result does not preclude SARS-Cov-2 infection and should not be used as the sole basis for treatment or other patient management decisions. A negative result may occur with  improper specimen collection/handling, submission of specimen other than nasopharyngeal swab, presence of viral mutation(s) within the areas targeted by this assay, and inadequate number of viral copies (<131 copies/mL). A negative result must be combined with clinical observations, patient history, and epidemiological information. The expected result is Negative.  Fact Sheet for Patients:  https://www.moore.com/  Fact Sheet for Healthcare Providers:  https://www.young.biz/  This test is no t yet approved or cleared by the [place] FDA and  has been authorized for detection and/or diagnosis of SARS-CoV-2 by FDA under an Emergency Use Authorization (EUA). This EUA will remain  in effect (meaning this test can be used) for the duration of the COVID-19 declaration under Section 564(b)(1) of the Act, 21 U.S.C. section 360bbb-3(b)(1), unless the authorization is terminated or revoked sooner.     Influenza A by PCR NEGATIVE NEGATIVE Final   Influenza B by PCR NEGATIVE NEGATIVE Final    Comment: (NOTE) The Xpert Xpress SARS-CoV-2/FLU/RSV assay is intended as  an aid in  the diagnosis of influenza from Nasopharyngeal swab specimens and  should not be used as a sole basis for treatment. Nasal washings and  aspirates are unacceptable for Xpert Xpress SARS-CoV-2/FLU/RSV  testing.  Fact Sheet for Patients: https://www.moore.com/  Fact Sheet for Healthcare Providers: https://www.young.biz/  This test is not yet approved or cleared by the [place] FDA and  has been authorized for detection and/or diagnosis of SARS-CoV-2 by  FDA under an Emergency Use Authorization (EUA). This EUA will remain  in effect (meaning this test can be used) for the duration of the  Covid-19 declaration under Section 564(b)(1) of the Act, 21  U.S.C. section 360bbb-3(b)(1), unless the authorization is  terminated or revoked. Performed at [facility], [address] [place], [place] [address]     Procedures and diagnostic studies:  No results found.             LOS: 1 day   Kanchan Gal  [facility]   Pager on [email or web address]. If 7PM-7AM, please contact night-coverage at [email or web address]     06/08/2020, 10:24 AM

## 2020-06-08 NOTE — Progress Notes (Signed)
Hypoglycemic Event  CBG: 66  Treatment: 12.5g of Dextrose 50% IV given at 2022  Symptoms: Asymptomatic  Follow-up CBG: Time: 2037 CBG Result: 105  Possible Reasons for Event: Pt NPO and saline locked      Daniel Walton

## 2020-06-08 NOTE — Discharge Instructions (Signed)
Hepatic Encephalopathy  Hepatic encephalopathy is a loss of brain function due to advanced liver disease. When the liver is damaged, harmful substances (toxins) can build up in the body. Some of these toxins, such as ammonia, can harm the brain. The effects of the condition depend on the type of liver damage and how severe it is. In some cases, hepatic encephalopathy can be reversed. What are the causes? Certain things can trigger or worsen hepatic encephalopathy, such as:  Infection.  Constipation.  Taking certain medicines, such as benzodiazepines.  Alcohol use.  Bleeding into the intestinal tract.  Imbalances in minerals (electrolytes) in the body.  Dehydration. Hepatic encephalopathy can sometimes be reversed if these triggers are resolved. What increases the risk? You are at risk of developing this condition if you have advanced liver disease (cirrhosis). Conditions that can cause liver disease include:  Infections in the liver, such as hepatitis C.  Infections in the blood.  Drinking a lot of alcohol over a long period of time.  Taking certain medicines, including tranquilizers, diuretics, antidepressants, sleeping pills, or acetaminophen.  Genetic diseases, such as Wilson's disease. What are the signs or symptoms? Symptoms may develop suddenly. Or, they may develop slowly and get worse gradually. Symptoms can range from mild to severe. Mild symptoms include:  Mild confusion.  Shortened attention span.  Personality and mood changes.  Anxiety and agitation.  Drowsiness. Symptoms of worsening or severe hepatic encephalopathy include:  Extreme confusion (disorientation).  Slowed movement.  Slurred speech.  Extreme personality changes.  Abnormal shaking or flapping of the hands.  Coma. How is this diagnosed? This condition may be diagnosed based on:  A physical exam.  Your symptoms and medical history.  Blood tests. These may be done to check levels  of ammonia in your blood, measure how long it takes your blood to clot, or check for infection.  Liver function tests. These may be done to check how well your liver is working.  MRI and CT scans. These may be done to check for a brain disorder and to check for problems with your liver.  Electroencephalogram (EEG). This test measures the electrical activity in your brain. How is this treated? The first step in treatment is to identify and treat the cause of your liver damage or triggering illness, if possible. The next step is taking medicine to lower the level of toxins in your body and prevent ammonia from building up. Treatment will depend on how severe your encephalopathy is, and may include:  Medicine to lower your ammonia level (lactulose).  Antibiotic medicine to reduce the amount of ammonia-producing bacteria in your gut.  Close monitoring of your blood pressure, heart rate, breathing, and oxygen levels.  Removal of fluid from your abdomen.  Close monitoring of how you think, feel, and act (mental status).  Dietary changes.  Liver transplant, in severe cases. Follow these instructions at home: Eating and drinking   Work with a dietitian or with your health care provider to make sure you are getting the right balance of protein and minerals.  Drink enough fluids to keep your urine pale yellow.  Do not drink alcohol or use drugs. General instructions  If you were prescribed an antibiotic, take it as told by your health care provider. Do not stop taking the antibiotic even if your condition improves.  Take other over-the-counter and prescription medicines only as told by your health care provider.  Do not start taking any new medicines, including over-the-counter medicines, without first   checking with your health care provider.  Keep all follow-up visits as told by your health care provider. This is important. Contact a health care provider if:  You develop new  symptoms.  Your symptoms change or get worse.  You have a fever.  You are constipated. Signs of constipation include having: ? Fewer bowel movements in a week than normal. ? Trouble having a bowel movement. ? Stools that are dry, hard, or larger than normal.  You have persistent nausea, vomiting, or diarrhea. Get help right away if:  You become very confused or drowsy.  You vomit blood or material that looks like coffee grounds.  Your stool is bloody, black, or looks like tar. Summary  Hepatic encephalopathy is a loss of brain function due to advanced liver disease. When the liver is damaged, harmful substances (toxins) can build up in your body. Some of these toxins, such as ammonia, can harm your brain.  Certain things can trigger or worsen hepatic encephalopathy. Hepatic encephalopathy can sometimes be reversed if these triggers are resolved.  The first step in treatment is to identify and treat the cause of your liver damage or triggering illness, if possible. The next step is taking medicine to lower the level of toxins in your body and prevent ammonia from building up.  Your treatment will depend on how severe your hepatic encephalopathy is. This information is not intended to replace advice given to you by your health care provider. Make sure you discuss any questions you have with your health care provider. Document Revised: 06/25/2017 Document Reviewed: 04/13/2017 Elsevier Patient Education  2020 [employer]  

## 2020-06-09 ENCOUNTER — Inpatient Hospital Stay: Payer: Self-pay

## 2020-06-09 LAB — GLUCOSE, CAPILLARY
Glucose-Capillary: 113 mg/dL — ABNORMAL HIGH (ref 70–99)
Glucose-Capillary: 68 mg/dL — ABNORMAL LOW (ref 70–99)
Glucose-Capillary: 73 mg/dL (ref 70–99)
Glucose-Capillary: 75 mg/dL (ref 70–99)
Glucose-Capillary: 79 mg/dL (ref 70–99)
Glucose-Capillary: 82 mg/dL (ref 70–99)
Glucose-Capillary: 87 mg/dL (ref 70–99)

## 2020-06-09 LAB — BASIC METABOLIC PANEL
Anion gap: 8 (ref 5–15)
BUN: 21 mg/dL — ABNORMAL HIGH (ref 6–20)
CO2: 20 mmol/L — ABNORMAL LOW (ref 22–32)
Calcium: 8.4 mg/dL — ABNORMAL LOW (ref 8.9–10.3)
Chloride: 114 mmol/L — ABNORMAL HIGH (ref 98–111)
Creatinine, Ser: 0.99 mg/dL (ref 0.61–1.24)
GFR, Estimated: >60 mL/min (ref >60)
Glucose, Bld: 87 mg/dL (ref 70–99)
Potassium: 3.6 mmol/L (ref 3.5–5.1)
Sodium: 142 mmol/L (ref 135–145)

## 2020-06-09 LAB — CBC WITH DIFFERENTIAL/PLATELET
Abs Immature Granulocytes: 0.01 10*3/uL (ref 0.00–0.07)
Basophils Absolute: 0 10*3/uL (ref 0.0–0.1)
Basophils Relative: 1 %
Eosinophils Absolute: 0.2 10*3/uL (ref 0.0–0.5)
Eosinophils Relative: 5 %
HCT: 26.6 % — ABNORMAL LOW (ref 39.0–52.0)
Hemoglobin: 8.8 g/dL — ABNORMAL LOW (ref 13.0–17.0)
Immature Granulocytes: 0 %
Lymphocytes Relative: 25 %
Lymphs Abs: 0.9 10*3/uL (ref 0.7–4.0)
MCH: 32.5 pg (ref 26.0–34.0)
MCHC: 33.1 g/dL (ref 30.0–36.0)
MCV: 98.2 fL (ref 80.0–100.0)
Monocytes Absolute: 0.5 10*3/uL (ref 0.1–1.0)
Monocytes Relative: 15 %
Neutro Abs: 1.9 10*3/uL (ref 1.7–7.7)
Neutrophils Relative %: 54 %
Platelets: 90 10*3/uL — ABNORMAL LOW (ref 150–400)
RBC: 2.71 MIL/uL — ABNORMAL LOW (ref 4.22–5.81)
RDW: 18.7 % — ABNORMAL HIGH (ref 11.5–15.5)
WBC: 3.5 10*3/uL — ABNORMAL LOW (ref 4.0–10.5)
nRBC: 0 % (ref 0.0–0.2)

## 2020-06-09 LAB — AMMONIA: Ammonia: 26 umol/L (ref 9–35)

## 2020-06-09 MED ORDER — LACTULOSE 10 GM/15ML PO SOLN
20.0000 g | Freq: Three times a day (TID) | ORAL | Status: DC
  Administered 2020-06-09 – 2020-06-10 (×4): 20 g via ORAL
  Filled 2020-06-09 (×4): qty 30

## 2020-06-09 MED ORDER — DEXTROSE 50 % IV SOLN
INTRAVENOUS | Status: AC
  Filled 2020-06-09: qty 50

## 2020-06-09 MED ORDER — DEXTROSE 50 % IV SOLN
12.5000 g | INTRAVENOUS | Status: AC
  Administered 2020-06-09: 12.5 g via INTRAVENOUS

## 2020-06-09 MED ORDER — FUROSEMIDE 40 MG PO TABS
40.0000 mg | ORAL_TABLET | Freq: Two times a day (BID) | ORAL | Status: DC
  Administered 2020-06-09 – 2020-06-10 (×2): 40 mg via ORAL
  Filled 2020-06-09 (×2): qty 1

## 2020-06-09 NOTE — Progress Notes (Signed)
   06/09/20 1025  What Happened  Was fall witnessed? No  Was patient injured? Yes  Patient found on floor  Found by Staff-comment (brother in room)  Stated prior activity other (comment) (climbed over bed railing)  Follow Up  MD notified [place]  Time MD notified 1030  Family notified Yes - comment (family in room)  Time family notified 1025  Additional tests Yes-comment (CT)  Progress note created (see row info) Yes  Adult Fall Risk Assessment  Risk Factor Category (scoring not indicated) Fall has occurred during this admission (document High fall risk)  Patient Fall Risk Level High fall risk  Adult Fall Risk Interventions  Required Bundle Interventions *See Row Information* High fall risk - low, moderate, and high requirements implemented  Additional Interventions Family Supervision;Room near nurses station;Use of appropriate toileting equipment (bedpan, BSC, etc.)  Screening for Fall Injury Risk (To be completed on HIGH fall risk patients) - Assessing Need for Low Bed  Risk For Fall Injury- Low Bed Criteria Previous fall this admission  Will Implement Low Bed and Floor Mats Yes  Screening for Fall Injury Risk (To be completed on HIGH fall risk patients who do not meet crieteria for Low Bed) - Assessing Need for Floor Mats Only  Risk For Fall Injury- Criteria for Floor Mats Confusion/dementia (+NuDESC, CIWA, TBI, etc.)  Will Implement Floor Mats Yes  Vitals  Temp 98.8 F (37.1 C)  Temp Source Oral  BP (!) 141/83  BP Location Left Arm  BP Method Automatic  Patient Position (if appropriate) Lying  Pulse Rate 96  Pulse Rate Source Dinamap  Oxygen Therapy  SpO2 100 %  O2 Device Room Air  Neurological  Neuro (WDL) X  Level of Consciousness Responds to Voice  Cognition Poor attention/concentration;Poor judgement;Poor safety awareness  Neuro Symptoms Drowsiness  Musculoskeletal  Musculoskeletal (WDL) X  Assistive Device None  Generalized Weakness Yes  Integumentary   Integumentary (WDL) X  Skin Color Jaundice  Skin Condition Dry  Skin Integrity Intact  Skin Turgor Non-tenting

## 2020-06-09 NOTE — Progress Notes (Signed)
Hypoglycemic Event  CBG: 68  Treatment: 12.5g of Dextrose 50% IV given at 2027  Symptoms: Asymptomatic  Follow-up CBG: Time: 2044 CBG Result: 113

## 2020-06-09 NOTE — Progress Notes (Signed)
Progress Note    Daniel Walton  ENI:[id number] DOB: 1971-04-12  DOA: 05/30/2020 PCP: [facility]      Brief Narrative:    Medical records reviewed and are as summarized below:  Daniel Walton is a 49 y.o. 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  He was recently discharged from [facility] about 10 days prior to admission after hospitalization for hepatic encephalopathy.  Since his recent discharge, he has not been taking his medicines and has continued to drink alcohol.  He was brought to the hospital because of increasing confusion and agitation.      Assessment/Plan:   Principal Problem:   Hepatic encephalopathy (HCC) Active Problems:   Alcoholic cirrhosis of liver with ascites (HCC)   Alcohol dependence (HCC)    Body mass index is 32.86 kg/m. ( obesity)   Hepatic encephalopathy: Mental status is better today although he is still confused.  He is not as lethargic as he was yesterday.  Ammonia level is better.  Continue lactulose (changed from per rectum to by mouth) and rifaximin.    Alcoholic liver cirrhosis complicated by thrombocytopenia, grade 1 esophageal varices, ascites and coagulopathy: Change IV to oral Lasix.  Continue Aldactone.  Continue Aldactone.   S/p fall on 06/09/2020: Reportedly, patient hit his face and he sustained a small soft tissue contusion above the right eyebrow.  Obtain CT head for further evaluation.  Alcohol use disorder: Continue multivitamins.  Ativan as needed per CIWA protocol.  Plan of care was discussed with his brother, Jesus, the bedside.   Diet Order            Diet NPO time specified Except for: Sips with Meds  Diet effective now                     Consultants:  None  Procedures:  None    Medications:   . enoxaparin (LOVENOX) injection  0.5 mg/kg Subcutaneous Q24H  . folic acid  1 mg Oral Daily  . furosemide  40 mg Oral BID  . lactulose  20 g Oral TID  . multivitamin with minerals  1 tablet Oral Daily  . rifaximin  200 mg Oral BID  . spironolactone  100 mg Oral Daily  . thiamine  100 mg Oral Daily   Continuous Infusions: . sodium chloride Stopped (06/08/20 1037)     Anti-infectives (From admission, onward)   Start     Dose/Rate Route Frequency Ordered Stop   06/24/2020 1000  cefTRIAXone (ROCEPHIN) 2 g in sodium chloride 0.9 % 100 mL IVPB  Status:  Discontinued        2 g 200 mL/hr over 30 Minutes Intravenous Every 24 hours 06/03/2020 0945 06/08/20 1042   05/31/2020 1000  rifaximin (XIFAXAN) tablet 200 mg        200 mg Oral 2 times daily 05/27/2020 0950               Family Communication/Anticipated D/C date and plan/Code Status   DVT prophylaxis:      Code Status: Full Code  Family Communication: Plan discussed with his brother at the bedside Disposition Plan:    Status is: Inpatient  Remains inpatient appropriate because:Altered mental status, IV treatments appropriate due to intensity of illness or inability to take PO and Inpatient level of care  appropriate due to severity of illness   Dispo: The patient is from: Home              Anticipated d/c is to: Home              Anticipated d/c date is: 2 days              Patient currently is not medically stable to d/c.           Subjective:   Interval events noted.  His brother is at the bedside.  Patient for this morning in his room.  His brother said he was in the room when he fell.  He said patient rolled off the bed and fell over the bed rails onto the floor.  He said even though bedrails were up, he was able to roll over the back.   Objective:    Vitals:   06/08/20 1626 06/08/20 1927 06/09/20 0400 06/09/20 1025  BP: 133/73  140/75 138/68 (!) 141/83  Pulse: 82 84 80 96  Resp: 16 20 20    Temp:  (!) 97.5 F (36.4 C) 98.9 F (37.2 C) 98.8 F (37.1 C)  TempSrc:  Axillary Oral Oral  SpO2: 100% 100% 100% 100%  Weight:      Height:       No data found.   Intake/Output Summary (Last 24 hours) at 06/09/2020 1114 Last data filed at 06/09/2020 0641 Gross per 24 hour  Intake 103.75 ml  Output 0 ml  Net 103.75 ml   Filed Weights   05/31/2020 0756 06/06/2020 1444  Weight: 97.7 kg 95.2 kg    Exam:  GEN: NAD SKIN: Small tear on the left leg.  Small bruise and soft tissue swelling above the right eyebrow from fall. EYES: EOMI. no pallor or icterus ENT: MMM CV: RRR PULM: CTA B ABD: soft, ND, NT, +BS CNS: AAO x 1 (person), non focal EXT: Right leg is bigger than left leg at baseline but edema has improved significantly.  No tenderness     Data Reviewed:   I have personally reviewed following labs and imaging studies:  Labs: Labs show the following:   Basic Metabolic Panel: Recent Labs  Lab 06/20/2020 0808 06/17/2020 0808 06/08/20 0509 06/09/20 0709  NA 137  --  140 142  K 4.7   < > 3.9 3.6  CL 110  --  116* 114*  CO2 19*  --  18* 20*  GLUCOSE 121*  --  86 87  BUN 17  --  17 21*  CREATININE 0.94  --  0.79 0.99  CALCIUM 8.2*  --  8.1* 8.4*  MG 1.8  --  1.9  --   PHOS 4.1  --  4.7*  --    < > = values in this interval not displayed.   GFR Estimated Creatinine Clearance: 100.3 mL/min (by C-G formula based on SCr of 0.99 mg/dL). Liver Function Tests: Recent Labs  Lab 06/03/2020 0808  AST 67*  ALT 24  ALKPHOS 132*  BILITOT 3.6*  PROT 7.1  ALBUMIN 1.8*   No results for input(s): LIPASE, AMYLASE in the last 168 hours. Recent Labs  Lab 06/03/2020 0808 06/09/20 0709  AMMONIA 108* 26   Coagulation profile No results for input(s): INR, PROTIME in the last 168 hours.  CBC: Recent Labs  Lab 06/10/2020 0808 06/08/20 0509 06/09/20 0709  WBC 4.5 4.1 3.5*  NEUTROABS 2.6  --  1.9  HGB 9.1*  8.8* 8.8*  HCT  26.7* 25.7* 26.6*  MCV 96.7 96.3 98.2  PLT 111* 86* 90*   Cardiac Enzymes: No results for input(s): CKTOTAL, CKMB, CKMBINDEX, TROPONINI in the last 168 hours. BNP (last 3 results) No results for input(s): PROBNP in the last 8760 hours. CBG: Recent Labs  Lab 06/08/20 2001 06/08/20 2037 06/09/20 0014 06/09/20 0402 06/09/20 0811  GLUCAP 66* 105* 87 73 82   D-Dimer: No results for input(s): DDIMER in the last 72 hours. Hgb A1c: No results for input(s): HGBA1C in the last 72 hours. Lipid Profile: No results for input(s): CHOL, HDL, LDLCALC, TRIG, CHOLHDL, LDLDIRECT in the last 72 hours. Thyroid function studies: No results for input(s): TSH, T4TOTAL, T3FREE, THYROIDAB in the last 72 hours.  Invalid input(s): FREET3 Anemia work up: No results for input(s): VITAMINB12, FOLATE, FERRITIN, TIBC, IRON, RETICCTPCT in the last 72 hours. Sepsis Labs: Recent Labs  Lab 06/13/2020 0808 06/08/20 0509 06/09/20 0709  WBC 4.5 4.1 3.5*  LATICACIDVEN 1.4  --   --     Microbiology Recent Results (from the past 240 hour(s))  Respiratory Panel by RT PCR (Flu A&B, Covid) - Nasopharyngeal Swab     Status: None   Collection Time: 06/05/2020  8:58 AM   Specimen: Nasopharyngeal Swab  Result Value Ref Range Status   SARS Coronavirus 2 by RT PCR NEGATIVE NEGATIVE Final    Comment: (NOTE) SARS-CoV-2 target nucleic acids are NOT DETECTED.  The SARS-CoV-2 RNA is generally detectable in upper respiratoy specimens during the acute phase of infection. The lowest concentration of SARS-CoV-2 viral copies this assay can detect is 131 copies/mL. A negative result does not preclude SARS-Cov-2 infection and should not be used as the sole basis for treatment or other patient management decisions. A negative result may occur with  improper specimen collection/handling, submission of specimen other than nasopharyngeal swab, presence of viral mutation(s) within the areas targeted by this assay,  and inadequate number of viral copies (<131 copies/mL). A negative result must be combined with clinical observations, patient history, and epidemiological information. The expected result is Negative.  Fact Sheet for Patients:  https://www.moore.com/  Fact Sheet for Healthcare Providers:  https://www.young.biz/  This test is no t yet approved or cleared by the [place] FDA and  has been authorized for detection and/or diagnosis of SARS-CoV-2 by FDA under an Emergency Use Authorization (EUA). This EUA will remain  in effect (meaning this test can be used) for the duration of the COVID-19 declaration under Section 564(b)(1) of the Act, 21 U.S.C. section 360bbb-3(b)(1), unless the authorization is terminated or revoked sooner.     Influenza A by PCR NEGATIVE NEGATIVE Final   Influenza B by PCR NEGATIVE NEGATIVE Final    Comment: (NOTE) The Xpert Xpress SARS-CoV-2/FLU/RSV assay is intended as an aid in  the diagnosis of influenza from Nasopharyngeal swab specimens and  should not be used as a sole basis for treatment. Nasal washings and  aspirates are unacceptable for Xpert Xpress SARS-CoV-2/FLU/RSV  testing.  Fact Sheet for Patients: https://www.moore.com/  Fact Sheet for Healthcare Providers: https://www.young.biz/  This test is not yet approved or cleared by the [place] FDA and  has been authorized for detection and/or diagnosis of SARS-CoV-2 by  FDA under an Emergency Use Authorization (EUA). This EUA will remain  in effect (meaning this test can be used) for the duration of the  Covid-19 declaration under Section 564(b)(1) of the Act, 21  U.S.C. section 360bbb-3(b)(1), unless the authorization is  terminated or revoked. Performed at  [facility], [address] [place], [place] [address]     Procedures and diagnostic studies:  No results found.              LOS: 2 days   Serin Thornell  [facility]   Pager on [email or web address]. If 7PM-7AM, please contact night-coverage at [email or web address]     06/09/2020, 11:14 AM

## 2020-06-09 NOTE — Progress Notes (Signed)
Patient had unwitnessed fall. Brother was in room and says he closed his eyes for a minute and heard pt hit floor before alarm went off. MD notified. Lump over right eye and small scratch on left shin. CT ordered. Will continue to monitor patient.

## 2020-06-10 DIAGNOSIS — F10231 Alcohol dependence with withdrawal delirium: Secondary | ICD-10-CM

## 2020-06-10 DIAGNOSIS — K7031 Alcoholic cirrhosis of liver with ascites: Secondary | ICD-10-CM

## 2020-06-10 DIAGNOSIS — K7290 Hepatic failure, unspecified without coma: Secondary | ICD-10-CM

## 2020-06-10 LAB — GLUCOSE, CAPILLARY
Glucose-Capillary: 105 mg/dL — ABNORMAL HIGH (ref 70–99)
Glucose-Capillary: 70 mg/dL (ref 70–99)
Glucose-Capillary: 72 mg/dL (ref 70–99)
Glucose-Capillary: 74 mg/dL (ref 70–99)
Glucose-Capillary: 75 mg/dL (ref 70–99)
Glucose-Capillary: 77 mg/dL (ref 70–99)
Glucose-Capillary: 96 mg/dL (ref 70–99)

## 2020-06-10 LAB — MRSA PCR SCREENING: MRSA by PCR: NEGATIVE

## 2020-06-10 MED ORDER — FUROSEMIDE 20 MG PO TABS: 40.0000 mg | ORAL_TABLET | Freq: Two times a day (BID) | ORAL | Status: DC

## 2020-06-10 MED ORDER — LACTULOSE 10 GM/15ML PO SOLN
30.0000 g | Freq: Three times a day (TID) | ORAL | Status: DC
  Administered 2020-06-10 – 2020-06-11 (×4): 30 g via ORAL
  Filled 2020-06-10 (×4): qty 60

## 2020-06-10 MED ORDER — CHLORHEXIDINE GLUCONATE CLOTH 2 % EX PADS
6.0000 | MEDICATED_PAD | Freq: Every day | CUTANEOUS | Status: DC
  Administered 2020-06-10 – 2020-06-13 (×4): 6 via TOPICAL

## 2020-06-10 MED ORDER — POTASSIUM CL IN DEXTROSE 5% 20 MEQ/L IV SOLN
20.0000 meq | INTRAVENOUS | Status: DC
  Administered 2020-06-10 – 2020-06-12 (×3): 20 meq via INTRAVENOUS
  Filled 2020-06-10 (×5): qty 1000

## 2020-06-10 MED ORDER — LORAZEPAM 1 MG PO TABS: 1.0000 mg | ORAL_TABLET | ORAL | Status: DC | PRN

## 2020-06-10 MED ORDER — CHLORDIAZEPOXIDE HCL 25 MG PO CAPS
25.0000 mg | ORAL_CAPSULE | Freq: Four times a day (QID) | ORAL | Status: DC
  Administered 2020-06-10 (×2): 25 mg via ORAL
  Filled 2020-06-10 (×2): qty 1

## 2020-06-10 MED ORDER — PANTOPRAZOLE SODIUM 40 MG IV SOLR
40.0000 mg | INTRAVENOUS | Status: DC
  Administered 2020-06-10: 40 mg via INTRAVENOUS
  Filled 2020-06-10: qty 40

## 2020-06-10 MED ORDER — LORAZEPAM 2 MG/ML IJ SOLN
1.0000 mg | INTRAMUSCULAR | Status: DC | PRN
  Administered 2020-06-10: 4 mg via INTRAVENOUS
  Administered 2020-06-10: 2 mg via INTRAVENOUS
  Administered 2020-06-10: 4 mg via INTRAVENOUS
  Filled 2020-06-10 (×3): qty 2

## 2020-06-10 MED ORDER — DEXMEDETOMIDINE HCL IN NACL 200 MCG/50ML IV SOLN
0.4000 ug/kg/h | INTRAVENOUS | Status: DC
  Administered 2020-06-10: 1 ug/kg/h via INTRAVENOUS

## 2020-06-10 NOTE — Progress Notes (Addendum)
Progress Note    Daniel Walton  NBV:[id number] DOB: 03/17/1971  DOA: 06/30/20 PCP: [facility]      Brief Narrative:    Medical records reviewed and are as summarized below:  Daniel Walton is a 49 y.o. 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  He was recently discharged from [facility] about 10 days prior to admission after hospitalization for hepatic encephalopathy.  Since his recent discharge, he has not been taking his medicines and has continued to drink alcohol.  He was brought to the hospital because of increasing confusion and agitation.      Assessment/Plan:   Principal Problem:   Hepatic encephalopathy (HCC) Active Problems:   Alcoholic cirrhosis of liver with ascites (HCC)   Alcohol dependence (HCC)    Body mass index is 32.86 kg/m. ( obesity)   Hepatic encephalopathy: Continue lactulose and rifaximin.   Alcoholic liver cirrhosis complicated by thrombocytopenia, grade 1 esophageal varices, ascites and coagulopathy: Hold diuretics.  Poor oral intake and hypoglycemia: Start low rate dextrose infusion.  Alcohol withdrawal syndrome with delirium: Start Librium.  Continue Ativan as needed per CIWA protocol.  Haldol as needed for agitation.  S/p fall on 06/09/2020: CT head on 06/09/2020 did not show any acute abnormality..  Plan discussed with his fiance, Donnald Garre, at the bedside.   ADDENDUM:  Patient became more aggressive and combative towards family and staff.  He did not respond to Librium, Ativan and Haldol.  He will be transferred to the ICU for Precedex infusion.  Consulted intensivist on-call, Dr. Belia Heman, who has graciously accepted the patient in transfer.   Diet Order            Diet NPO time specified Except for: Sips with Meds   Diet effective now                    Consultants:  None  Procedures:  None    Medications:   . chlordiazePOXIDE  25 mg Oral QID  . enoxaparin (LOVENOX) injection  0.5 mg/kg Subcutaneous Q24H  . folic acid  1 mg Oral Daily  . [START ON 06/11/2020] furosemide  40 mg Oral BID  . lactulose  30 g Oral TID  . multivitamin with minerals  1 tablet Oral Daily  . rifaximin  200 mg Oral BID  . spironolactone  100 mg Oral Daily  . thiamine  100 mg Oral Daily   Continuous Infusions: . sodium chloride Stopped (06/08/20 1037)  . dextrose 5 % with KCl 20 mEq / L 50 mL/hr at 06/10/20 1254     Anti-infectives (From admission, onward)   Start     Dose/Rate Route Frequency Ordered Stop   2020-06-30 1000  cefTRIAXone (ROCEPHIN) 2 g in sodium chloride 0.9 % 100 mL IVPB  Status:  Discontinued        2 g 200 mL/hr over 30 Minutes Intravenous Every 24 hours 30-Jun-2020 0945 06/08/20 1042   June 30, 2020 1000  rifaximin (XIFAXAN) tablet 200 mg        200 mg Oral 2 times daily 2020/06/30 0950               Family Communication/Anticipated D/C date and plan/Code Status   DVT prophylaxis:      Code Status: Full Code  Family Communication: Plan discussed with his fiance  at the bedside Disposition Plan:    Status is: Inpatient  Remains inpatient appropriate because:Altered mental status, IV treatments appropriate due to intensity of illness or inability to take PO and Inpatient level of care appropriate due to severity of illness   Dispo: The patient is from: Home              Anticipated d/c is to: Home              Anticipated d/c date is: 2 days              Patient currently is not medically stable to d/c.           Subjective:   Interval events noted.  According to his nurse, patient patient was very confused and agitated overnight.  His fiance, Donnald Garre, and his nurse, Misty, were present at the bedside.  Objective:    Vitals:   06/09/20 1630 06/09/20 2021  06/10/20 0330 06/10/20 1134  BP:  (!) 144/89 107/71 (!) 133/94  Pulse: 77 87 89 88  Resp:  20 18   Temp:  98 F (36.7 C) (!) 97.1 F (36.2 C) 97.8 F (36.6 C)  TempSrc:  Axillary Axillary   SpO2: 100% 99% 100% 100%  Weight:      Height:       No data found.   Intake/Output Summary (Last 24 hours) at 06/10/2020 1500 Last data filed at 06/10/2020 1254 Gross per 24 hour  Intake 96.21 ml  Output 200 ml  Net -103.79 ml   Filed Weights   06/08/2020 0756 06/17/2020 1444  Weight: 97.7 kg 95.2 kg    Exam:   GEN: NAD SKIN: Warm and dry.  Soft tissue swelling about the right eyebrow has resolved EYES: EOMI, PERRL ENT: MMM CV: RRR PULM: CTA B ABD: soft, ND, NT, +BS CNS: Alert but confused, non focal EXT: No edema or tenderness.  Right leg is bigger than the left leg     Data Reviewed:   I have personally reviewed following labs and imaging studies:  Labs: Labs show the following:   Basic Metabolic Panel: Recent Labs  Lab 06/20/2020 0808 06/06/2020 0808 06/08/20 0509 06/09/20 0709  NA 137  --  140 142  K 4.7   < > 3.9 3.6  CL 110  --  116* 114*  CO2 19*  --  18* 20*  GLUCOSE 121*  --  86 87  BUN 17  --  17 21*  CREATININE 0.94  --  0.79 0.99  CALCIUM 8.2*  --  8.1* 8.4*  MG 1.8  --  1.9  --   PHOS 4.1  --  4.7*  --    < > = values in this interval not displayed.   GFR Estimated Creatinine Clearance: 100.3 mL/min (by C-G formula based on SCr of 0.99 mg/dL). Liver Function Tests: Recent Labs  Lab 06/15/2020 0808  AST 67*  ALT 24  ALKPHOS 132*  BILITOT 3.6*  PROT 7.1  ALBUMIN 1.8*   No results for input(s): LIPASE, AMYLASE in the last 168 hours. Recent Labs  Lab 06/01/2020 0808 06/09/20 0709  AMMONIA 108* 26   Coagulation profile No results for input(s): INR, PROTIME in the last 168 hours.  CBC: Recent Labs  Lab 06/25/2020 0808 06/08/20 0509 06/09/20 0709  WBC 4.5 4.1 3.5*  NEUTROABS 2.6  --  1.9  HGB 9.1* 8.8* 8.8*  HCT 26.7* 25.7* 26.6*  MCV  96.7 96.3 98.2  PLT 111*  86* 90*   Cardiac Enzymes: No results for input(s): CKTOTAL, CKMB, CKMBINDEX, TROPONINI in the last 168 hours. BNP (last 3 results) No results for input(s): PROBNP in the last 8760 hours. CBG: Recent Labs  Lab 06/09/20 2044 06/10/20 0047 06/10/20 0340 06/10/20 0737 06/10/20 1133  GLUCAP 113* 74 75 70 72   D-Dimer: No results for input(s): DDIMER in the last 72 hours. Hgb A1c: No results for input(s): HGBA1C in the last 72 hours. Lipid Profile: No results for input(s): CHOL, HDL, LDLCALC, TRIG, CHOLHDL, LDLDIRECT in the last 72 hours. Thyroid function studies: No results for input(s): TSH, T4TOTAL, T3FREE, THYROIDAB in the last 72 hours.  Invalid input(s): FREET3 Anemia work up: No results for input(s): VITAMINB12, FOLATE, FERRITIN, TIBC, IRON, RETICCTPCT in the last 72 hours. Sepsis Labs: Recent Labs  Lab 2020/06/17 0808 06/08/20 0509 06/09/20 0709  WBC 4.5 4.1 3.5*  LATICACIDVEN 1.4  --   --     Microbiology Recent Results (from the past 240 hour(s))  Respiratory Panel by RT PCR (Flu A&B, Covid) - Nasopharyngeal Swab     Status: None   Collection Time: June 17, 2020  8:58 AM   Specimen: Nasopharyngeal Swab  Result Value Ref Range Status   SARS Coronavirus 2 by RT PCR NEGATIVE NEGATIVE Final    Comment: (NOTE) SARS-CoV-2 target nucleic acids are NOT DETECTED.  The SARS-CoV-2 RNA is generally detectable in upper respiratoy specimens during the acute phase of infection. The lowest concentration of SARS-CoV-2 viral copies this assay can detect is 131 copies/mL. A negative result does not preclude SARS-Cov-2 infection and should not be used as the sole basis for treatment or other patient management decisions. A negative result may occur with  improper specimen collection/handling, submission of specimen other than nasopharyngeal swab, presence of viral mutation(s) within the areas targeted by this assay, and inadequate number of viral  copies (<131 copies/mL). A negative result must be combined with clinical observations, patient history, and epidemiological information. The expected result is Negative.  Fact Sheet for Patients:  https://www.moore.com/  Fact Sheet for Healthcare Providers:  https://www.young.biz/  This test is no t yet approved or cleared by the [place] FDA and  has been authorized for detection and/or diagnosis of SARS-CoV-2 by FDA under an Emergency Use Authorization (EUA). This EUA will remain  in effect (meaning this test can be used) for the duration of the COVID-19 declaration under Section 564(b)(1) of the Act, 21 U.S.C. section 360bbb-3(b)(1), unless the authorization is terminated or revoked sooner.     Influenza A by PCR NEGATIVE NEGATIVE Final   Influenza B by PCR NEGATIVE NEGATIVE Final    Comment: (NOTE) The Xpert Xpress SARS-CoV-2/FLU/RSV assay is intended as an aid in  the diagnosis of influenza from Nasopharyngeal swab specimens and  should not be used as a sole basis for treatment. Nasal washings and  aspirates are unacceptable for Xpert Xpress SARS-CoV-2/FLU/RSV  testing.  Fact Sheet for Patients: https://www.moore.com/  Fact Sheet for Healthcare Providers: https://www.young.biz/  This test is not yet approved or cleared by the [place] FDA and  has been authorized for detection and/or diagnosis of SARS-CoV-2 by  FDA under an Emergency Use Authorization (EUA). This EUA will remain  in effect (meaning this test can be used) for the duration of the  Covid-19 declaration under Section 564(b)(1) of the Act, 21  U.S.C. section 360bbb-3(b)(1), unless the authorization is  terminated or revoked. Performed at [facility], [address], [place], [place] [address]  Procedures and diagnostic studies:  CT HEAD WO CONTRAST  Result Date: 06/09/2020 CLINICAL DATA:  Larey SeatFell  today.  Trauma to the head. EXAM: CT HEAD WITHOUT CONTRAST TECHNIQUE: Contiguous axial images were obtained from the base of the skull through the vertex without intravenous contrast. COMPARISON:  11/18/2018 CT.  11/18/2018 MRI. FINDINGS: Brain: The brainstem and cerebellum are normal. Cerebral hemispheres show mild generalized atrophy. 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 No other focal brain insult. No mass lesion, hemorrhage, hydrocephalus or extra-axial collection. Vascular: No abnormal vascular finding. Skull: Normal Sinuses/Orbits: Clear/normal Other: None IMPRESSION: No acute or traumatic finding. 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 Mild generalized atrophy. Electronically Signed   By: Paulina FusiMark  Shogry M.D.   On: 06/09/2020 12:58               LOS: 3 days   Makhari Dovidio  [facility]   Pager on [email or web address]. If 7PM-7AM, please contact night-coverage at [email or web address]     06/10/2020, 3:00 PM

## 2020-06-10 NOTE — Progress Notes (Signed)
Report given to [facility]. Transferred patient to ICU02.   Madie Reno, RN

## 2020-06-10 NOTE — Progress Notes (Addendum)
Patient becoming very agitated and combative. Pt to agitated for nursing to take VS at this time. Haldol given. Fiance by bedside to assist with patient's safety. MD paged.   Madie Reno, RN

## 2020-06-10 NOTE — Progress Notes (Signed)
Transfer order for ICU. Bed pending.   Madie Reno, RN

## 2020-06-10 NOTE — Progress Notes (Signed)
Patient agitated and kicking legs at family member. Beside RN and NT tried redirecting patient back in bed. Beside RN administered PRN haldol and ativan to deescalate patient's agitation. PRN meds doesn't seem to be helping. Dr. Myriam Forehand paged.   Madie Reno, RN

## 2020-06-10 NOTE — Consult Note (Signed)
NAME:  Daniel Walton, MRN:  [id number], DOB:  1970/08/18, LOS: 3 ADMISSION DATE:  06/17/2020, CONSULTATION DATE:  06/10/2020 REFERRING MD:  TRH, CHIEF COMPLAINT:  Encephalopathy   Brief History   49 year old male with ETOH abuse and alcoholic liver cirrhosis admitted 11/12 for hepatic encephalopathy and possible alcohol withdrawal after non-compliance with home medications and ongoing ETOH abuse.  On 11/15, became more agitated and combative despite ativan, librium, and haldol therefore transferred to the ICU and started on precedex.   History of present illness   HPI obtained from medical chart review as patient is encephalopathic.   49 year old male with prior history of ETOH abuse, alcoholic liver cirrhosis, SBP, esophageal varices, DM, ventral and inguinal hernia admitted on 11/12 to [facility] with hepatic encephalopathy.    Recently admitted at [facility]  10/19- 10/22 for decompensated cirrhosis and again on 10/29- 11/1 for hepatic encephalopathy and and decompensated cirrhosis.  He was discharged home however was noncompliant with his medications and continued to drink alcohol.   He presented back with worsening confusion and agitation.  He admitted to [facility] for hepatic encephalopathy, possible alcohol withdrawal, and empircally started on SBP coverage.  He was restarted on his home medications and CIWA with ativan.  ETOH was negative on admit.  Hospital course with confusion slowly improving, however course complicated by a fall out of bed on 11/14 with negative CT head.  He continued on ativan CIWA, librium added 11/15, however he became more agitated and combative despite ativan, librium, and haldol.  He was therefore transferred to the ICU and started on precedex infusion.  He remains afebrile and hemodynamically stable otherwise.  PCCM consulted for further ICU management.   Past Medical History  Alcoholic liver cirrhosis, ascites and SBP, esophageal varices (grade 1), DM, ETOH abuse, ventral and  inguinal hernia  Significant Hospital Events   11/12 admitted 11/15 tx ICU for precedex  Consults:   Procedures:   Significant Diagnostic Tests:  11/14 [facility] >>No acute or traumatic finding. 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 Mild generalized atrophy.  Micro Data:  11/12 SARS 2/ flu >> neg 11/15 MRSA >>   Antimicrobials:  Ceftriaxone 11/12 >>11/13  Interim history/subjective:  Precedex at 1.2  Objective   Blood pressure 122/85, pulse 78, temperature 97.9 F (36.6 C), temperature source Oral, resp. rate 18, height 5' 7.01" (1.702 m), weight 95.2 kg, SpO2 100 %.        Intake/Output Summary (Last 24 hours) at 06/10/2020 1935 Last data filed at 06/10/2020 1600 Gross per 24 hour  Intake 258.74 ml  Output 200 ml  Net 58.74 ml   Filed Weights   06/16/2020 0756 06/02/2020 1444  Weight: 97.7 kg 95.2 kg   Examination: General:  Adult male lying in bed in NAD, sonorus at times HEENT: MM pink/ dry, pupils 2/reactive Neuro:  RASS -4, withdrawals to noxious stimuli CV: rr, NSR, no murmur PULM:  Non labored, on room air with normal saturations, CTA GI: soft, bs+, soft, soft ventral hernia  Extremities: warm/dry, no LE edema  Skin: no rashes, scattered bruises to arms, lower abd, tattoos   Resolved Hospital Problem list    Assessment & Plan:   Hepatic encephalopathy complicated by alcohol withdrawal with delirium - fall 11/14 with CTH neg P:  ICU monitoring Precedex for RASS goal 0/-1 Ongoing neuro checks NPO for now Aspiration precautions At risk for airway compromise/ intubation, currently he protecting his airway D/C ativan CIWA and scheduled librium.  Consider restarting scheduled ativan/ librium in am  Holding lactulose, rifaximin, lasix, spironlactone, thiamine, folic acid, and MVI - resume when mental status allows  Strict I/O's Recheck ammonia, CMET, CBC  in am    Hypoglycemia - secondary to poor PO intake, possible component of cirrhosis Hx  DM P:  CBG q 4 Continue dextrose infusion    Best practice:  Diet: NPO for now Pain/Anxiety/Delirium protocol (if indicated): precedex as above VAP protocol (if indicated): n/a DVT prophylaxis: lovenox GI prophylaxis: resume home PPI  Glucose control: CBG q 4 Mobility: BR Code Status: full  Family Communication: no family at bedside Disposition: ICU  Labs   CBC: Recent Labs  Lab 05/28/2020 0808 06/08/20 0509 06/09/20 0709  WBC 4.5 4.1 3.5*  NEUTROABS 2.6  --  1.9  HGB 9.1* 8.8* 8.8*  HCT 26.7* 25.7* 26.6*  MCV 96.7 96.3 98.2  PLT 111* 86* 90*    Basic Metabolic Panel: Recent Labs  Lab 06/22/2020 0808 06/08/20 0509 06/09/20 0709  NA 137 140 142  K 4.7 3.9 3.6  CL 110 116* 114*  CO2 19* 18* 20*  GLUCOSE 121* 86 87  BUN 17 17 21*  CREATININE 0.94 0.79 0.99  CALCIUM 8.2* 8.1* 8.4*  MG 1.8 1.9  --   PHOS 4.1 4.7*  --    GFR: Estimated Creatinine Clearance: 100.3 mL/min (by C-G formula based on SCr of 0.99 mg/dL). Recent Labs  Lab 06/06/2020 0808 06/08/20 0509 06/09/20 0709  WBC 4.5 4.1 3.5*  LATICACIDVEN 1.4  --   --     Liver Function Tests: Recent Labs  Lab 06/20/2020 0808  AST 67*  ALT 24  ALKPHOS 132*  BILITOT 3.6*  PROT 7.1  ALBUMIN 1.8*   No results for input(s): LIPASE, AMYLASE in the last 168 hours. Recent Labs  Lab 06/10/2020 0808 06/09/20 0709  AMMONIA 108* 26    ABG No results found for: PHART, PCO2ART, PO2ART, HCO3, TCO2, ACIDBASEDEF, O2SAT   Coagulation Profile: No results for input(s): INR, PROTIME in the last 168 hours.  Cardiac Enzymes: No results for input(s): CKTOTAL, CKMB, CKMBINDEX, TROPONINI in the last 168 hours.  HbA1C: No results found for: HGBA1C  CBG: Recent Labs  Lab 06/10/20 0047 06/10/20 0340 06/10/20 0737 06/10/20 1133 06/10/20 1618  GLUCAP 74 75 70 72 77    Review of Systems:   Unable to obtain secondary to encephalopathy   Past Medical History  He,  has a past medical history of Anxiety.    Surgical History    Past Surgical History:  Procedure Laterality Date  . ABDOMINAL SURGERY       Social History   reports that he has never smoked. He has never used smokeless tobacco. He reports current alcohol use. He reports current drug use.   Family History   His family history includes Hypertension in his father and mother.   Allergies No Known Allergies   Home Medications  Prior to Admission medications   Medication Sig Start Date End Date Taking? Authorizing Provider  CONSTULOSE 10 GM/15ML solution Take 30 mLs by mouth in the morning, at noon, in the evening, and at bedtime. 05/28/20  Yes [provider]  furosemide (LASIX) 40 MG tablet Take 40 mg by mouth daily.    Yes [provider]  magnesium oxide (MAG-OX) 400 (241.3 Mg) MG tablet Take 1 tablet by mouth 2 (two) times daily. 05/23/20  Yes [provider]  pantoprazole (PROTONIX) 40 MG tablet Take 40 mg by  mouth daily. 04/17/20  Yes [provider]  potassium chloride (KLOR-CON) 10 MEQ tablet Take 10 mEq by mouth daily. 05/23/20  Yes [provider]  propranolol (INDERAL) 20 MG tablet Take 20 mg by mouth daily. 04/17/20  Yes [provider]  spironolactone (ALDACTONE) 25 MG tablet Take 25 mg by mouth in the morning, at noon, in the evening, and at bedtime.  05/08/20  Yes [provider]  thiamine 100 MG tablet Take 1 tablet (100 mg total) by mouth daily. 11/20/18  Yes Gouru, Aruna, MD  lidocaine (LIDODERM) 5 % Place 1 patch onto the skin daily. 05/05/20   [provider]     Critical care time: 40 mins     Posey Boyer, ACNP [facility] 06/10/2020, 8:38 PM  See Amion for personal pager PCCM on call pager [phone]

## 2020-06-10 NOTE — Clinical Social Work Note (Signed)
CSW acknowledges consult for SA resources. Patient only oriented to self. Per RN note this morning, patient agitated and combative. Will follow for improvement in mental status before providing resources.  Charlynn Court, CSW [phone]

## 2020-06-11 ENCOUNTER — Inpatient Hospital Stay: Payer: Self-pay

## 2020-06-11 DIAGNOSIS — Z515 Encounter for palliative care: Secondary | ICD-10-CM

## 2020-06-11 DIAGNOSIS — Z7189 Other specified counseling: Secondary | ICD-10-CM

## 2020-06-11 LAB — CBC
HCT: 23 % — ABNORMAL LOW (ref 39.0–52.0)
Hemoglobin: 7.6 g/dL — ABNORMAL LOW (ref 13.0–17.0)
MCH: 32.6 pg (ref 26.0–34.0)
MCHC: 33 g/dL (ref 30.0–36.0)
MCV: 98.7 fL (ref 80.0–100.0)
Platelets: 67 10*3/uL — ABNORMAL LOW (ref 150–400)
RBC: 2.33 MIL/uL — ABNORMAL LOW (ref 4.22–5.81)
RDW: 17.5 % — ABNORMAL HIGH (ref 11.5–15.5)
WBC: 2.6 10*3/uL — ABNORMAL LOW (ref 4.0–10.5)
nRBC: 0 % (ref 0.0–0.2)

## 2020-06-11 LAB — BLOOD GAS, ARTERIAL
Acid-base deficit: 3.6 mmol/L — ABNORMAL HIGH (ref 0.0–2.0)
Bicarbonate: 19.5 mmol/L — ABNORMAL LOW (ref 20.0–28.0)
FIO2: 0.4
MECHVT: 0.5 mL
O2 Saturation: 95.4 %
PEEP: 5 cmH2O
Patient temperature: 37
RATE: 16 resp/min
pCO2 arterial: 28 mmHg — ABNORMAL LOW (ref 32.0–48.0)
pH, Arterial: 7.45 (ref 7.350–7.450)
pO2, Arterial: 74 mmHg — ABNORMAL LOW (ref 83.0–108.0)

## 2020-06-11 LAB — AMMONIA: Ammonia: 72 umol/L — ABNORMAL HIGH (ref 9–35)

## 2020-06-11 LAB — MAGNESIUM: Magnesium: 1.7 mg/dL (ref 1.7–2.4)

## 2020-06-11 LAB — GLUCOSE, CAPILLARY
Glucose-Capillary: 100 mg/dL — ABNORMAL HIGH (ref 70–99)
Glucose-Capillary: 118 mg/dL — ABNORMAL HIGH (ref 70–99)
Glucose-Capillary: 118 mg/dL — ABNORMAL HIGH (ref 70–99)
Glucose-Capillary: 120 mg/dL — ABNORMAL HIGH (ref 70–99)
Glucose-Capillary: 136 mg/dL — ABNORMAL HIGH (ref 70–99)
Glucose-Capillary: 156 mg/dL — ABNORMAL HIGH (ref 70–99)
Glucose-Capillary: 72 mg/dL (ref 70–99)

## 2020-06-11 LAB — COMPREHENSIVE METABOLIC PANEL
ALT: 21 U/L (ref 0–44)
AST: 67 U/L — ABNORMAL HIGH (ref 15–41)
Albumin: 1.5 g/dL — ABNORMAL LOW (ref 3.5–5.0)
Alkaline Phosphatase: 76 U/L (ref 38–126)
Anion gap: 7 (ref 5–15)
BUN: 19 mg/dL (ref 6–20)
CO2: 21 mmol/L — ABNORMAL LOW (ref 22–32)
Calcium: 8 mg/dL — ABNORMAL LOW (ref 8.9–10.3)
Chloride: 108 mmol/L (ref 98–111)
Creatinine, Ser: 0.86 mg/dL (ref 0.61–1.24)
GFR, Estimated: >60 mL/min (ref >60)
Glucose, Bld: 111 mg/dL — ABNORMAL HIGH (ref 70–99)
Potassium: 3.7 mmol/L (ref 3.5–5.1)
Sodium: 136 mmol/L (ref 135–145)
Total Bilirubin: 3 mg/dL — ABNORMAL HIGH (ref 0.3–1.2)
Total Protein: 6 g/dL — ABNORMAL LOW (ref 6.5–8.1)

## 2020-06-11 LAB — PHOSPHORUS: Phosphorus: 4.1 mg/dL (ref 2.5–4.6)

## 2020-06-11 MED ORDER — ETOMIDATE 2 MG/ML IV SOLN
INTRAVENOUS | Status: AC
  Filled 2020-06-11: qty 10

## 2020-06-11 MED ORDER — CHLORDIAZEPOXIDE HCL 25 MG PO CAPS
25.0000 mg | ORAL_CAPSULE | Freq: Four times a day (QID) | ORAL | Status: DC
  Administered 2020-06-11 (×3): 25 mg via ORAL
  Filled 2020-06-11 (×3): qty 1

## 2020-06-11 MED ORDER — MIDAZOLAM HCL 2 MG/2ML IJ SOLN: 2.0000 mg | INTRAMUSCULAR | Status: DC | PRN

## 2020-06-11 MED ORDER — DOPAMINE-DEXTROSE 3.2-5 MG/ML-% IV SOLN
0.0000 ug/kg/min | INTRAVENOUS | Status: DC
  Administered 2020-06-11: 5 ug/kg/min via INTRAVENOUS
  Filled 2020-06-11: qty 250

## 2020-06-11 MED ORDER — DEXMEDETOMIDINE HCL IN NACL 400 MCG/100ML IV SOLN
0.4000 ug/kg/h | INTRAVENOUS | Status: DC
  Administered 2020-06-11: 0.8 ug/kg/h via INTRAVENOUS
  Administered 2020-06-11: 0.4 ug/kg/h via INTRAVENOUS
  Filled 2020-06-11: qty 100

## 2020-06-11 MED ORDER — DOCUSATE SODIUM 50 MG/5ML PO LIQD
100.0000 mg | Freq: Two times a day (BID) | ORAL | Status: DC
  Administered 2020-06-11: 100 mg via ORAL
  Filled 2020-06-11: qty 10

## 2020-06-11 MED ORDER — PROPOFOL 1000 MG/100ML IV EMUL
0.0000 ug/kg/min | INTRAVENOUS | Status: DC
  Administered 2020-06-12: 10 ug/kg/min via INTRAVENOUS
  Administered 2020-06-12: 20 ug/kg/min via INTRAVENOUS
  Filled 2020-06-11 (×3): qty 100

## 2020-06-11 MED ORDER — FENTANYL 2500MCG IN NS 250ML (10MCG/ML) PREMIX INFUSION
50.0000 ug/h | INTRAVENOUS | Status: DC
  Administered 2020-06-12: 50 ug/h via INTRAVENOUS
  Filled 2020-06-11: qty 250

## 2020-06-11 MED ORDER — VECURONIUM BROMIDE 10 MG IV SOLR
10.0000 mg | Freq: Once | INTRAVENOUS | Status: AC
  Administered 2020-06-11: 10 mg via INTRAVENOUS

## 2020-06-11 MED ORDER — LORAZEPAM 2 MG/ML IJ SOLN
0.0000 mg | INTRAMUSCULAR | Status: DC
  Administered 2020-06-11: 4 mg via INTRAVENOUS
  Filled 2020-06-11: qty 2

## 2020-06-11 MED ORDER — MIDAZOLAM HCL 2 MG/2ML IJ SOLN
4.0000 mg | Freq: Once | INTRAMUSCULAR | Status: AC
  Administered 2020-06-11: 4 mg via INTRAVENOUS

## 2020-06-11 MED ORDER — PROPOFOL 1000 MG/100ML IV EMUL
INTRAVENOUS | Status: AC
  Administered 2020-06-11: 5 ug/kg/min via INTRAVENOUS
  Filled 2020-06-11: qty 100

## 2020-06-11 MED ORDER — MIDAZOLAM HCL 2 MG/2ML IJ SOLN
INTRAMUSCULAR | Status: AC
  Filled 2020-06-11: qty 4

## 2020-06-11 MED ORDER — FENTANYL CITRATE (PF) 100 MCG/2ML IJ SOLN
INTRAMUSCULAR | Status: AC
  Administered 2020-06-11: 100 ug
  Filled 2020-06-11: qty 2

## 2020-06-11 MED ORDER — FENTANYL CITRATE (PF) 100 MCG/2ML IJ SOLN: 50.0000 ug | Freq: Once | INTRAMUSCULAR | Status: AC

## 2020-06-11 MED ORDER — STERILE WATER FOR INJECTION IJ SOLN
INTRAMUSCULAR | Status: AC
  Administered 2020-06-11: 10 mL
  Filled 2020-06-11: qty 10

## 2020-06-11 MED ORDER — POLYETHYLENE GLYCOL 3350 17 G PO PACK: 17.0000 g | PACK | Freq: Every day | ORAL | Status: DC

## 2020-06-11 MED ORDER — FENTANYL BOLUS VIA INFUSION
50.0000 ug | INTRAVENOUS | Status: DC | PRN
  Filled 2020-06-11: qty 50

## 2020-06-11 MED ORDER — LORAZEPAM 2 MG/ML IJ SOLN: 0.0000 mg | Freq: Three times a day (TID) | INTRAMUSCULAR | Status: DC

## 2020-06-11 MED ORDER — ETOMIDATE 2 MG/ML IV SOLN
20.0000 mg | Freq: Once | INTRAVENOUS | Status: AC
  Administered 2020-06-11: 20 mg via INTRAVENOUS

## 2020-06-11 MED ORDER — PANTOPRAZOLE SODIUM 40 MG PO PACK
40.0000 mg | PACK | Freq: Every day | ORAL | Status: DC
  Administered 2020-06-11: 40 mg via ORAL
  Filled 2020-06-11: qty 20

## 2020-06-11 MED ORDER — ATROPINE SULFATE 1 MG/10ML IJ SOSY: 1.0000 mg | PREFILLED_SYRINGE | Freq: Once | INTRAMUSCULAR | Status: DC | PRN

## 2020-06-11 MED ORDER — VECURONIUM BROMIDE 10 MG IV SOLR
INTRAVENOUS | Status: AC
  Filled 2020-06-11: qty 10

## 2020-06-11 MED ORDER — CHLORHEXIDINE GLUCONATE 0.12% ORAL RINSE (MEDLINE KIT)
15.0000 mL | Freq: Two times a day (BID) | OROMUCOSAL | Status: DC
  Administered 2020-06-11 – 2020-06-13 (×4): 15 mL via OROMUCOSAL

## 2020-06-11 MED ORDER — ORAL CARE MOUTH RINSE
15.0000 mL | OROMUCOSAL | Status: DC
  Administered 2020-06-11 – 2020-06-13 (×16): 15 mL via OROMUCOSAL

## 2020-06-11 MED ORDER — SODIUM CHLORIDE 0.9 % IV BOLUS
1000.0000 mL | Freq: Once | INTRAVENOUS | Status: AC
  Administered 2020-06-11: 1000 mL via INTRAVENOUS

## 2020-06-11 MED ORDER — FENTANYL CITRATE (PF) 100 MCG/2ML IJ SOLN
100.0000 ug | Freq: Once | INTRAMUSCULAR | Status: AC
  Administered 2020-06-11: 100 ug via INTRAVENOUS
  Filled 2020-06-11: qty 2

## 2020-06-11 NOTE — Consult Note (Signed)
Consultation Note Date: 06/11/2020   Patient Name: Daniel Walton  DOB: 1971-05-01  MRN: [id number]  Age / Sex: 49 y.o., male  PCP: [facility] Referring Physician: Erin Fulling, MD  Reason for Consultation: Establishing goals of care  HPI/Patient Profile: 49 year old male with ETOH abuse and alcoholic liver cirrhosis admitted 11/12 for hepatic encephalopathy and possible alcohol withdrawal after non-compliance with home medications and ongoing ETOH abuse.   Clinical Assessment and Goals of Care: Patient is resting in bed with sedation in place. Spoke with girlfriend who is emergency contact. She states they live together. She states Mr. Arya understands his liver disease as he was just discharged from [facility] 2 weeks ago. She states he does things on his own terms and does not want to take his medications. She states his son is his primary decision maker though he has multiple children.   Spoke with son. He states he is his father's HPOA, and has those papers at home.      He tells me his father does not want to take medications as he should. He states his father understands he could die, but does not want to make life style modifications. The difference between an aggressive medical intervention path and a comfort care path was discussed.  Values and goals of care important to patient and family were attempted to be elicited. He states he would like to discuss this with his father.    Son plans to bring HPOA papers.       SUMMARY OF RECOMMENDATIONS   Son to bring HPOA papers. Will speak to his employer to find a time he can come to meet.   Prognosis:   Poor overall      Primary Diagnoses: Present on Admission: . Hepatic encephalopathy (HCC) . Alcoholic cirrhosis of liver with ascites (HCC) . Alcohol dependence (HCC)   I have reviewed the medical record, interviewed  the patient and family, and examined the patient. The following aspects are pertinent.  Past Medical History:  Diagnosis Date  . Anxiety    Social History   Socioeconomic History  . Marital status: Single    Spouse name: Not on file  . Number of children: Not on file  . Years of education: Not on file  . Highest education level: Not on file  Occupational History  . Not on file  Tobacco Use  . Smoking status: Never Smoker  . Smokeless tobacco: Never Used  Vaping Use  . Vaping Use: Never used  Substance and Sexual Activity  . Alcohol use: Yes  . Drug use: Yes  . Sexual activity: Not on file  Other Topics Concern  . Not on file  Social History Narrative  . Not on file   Social Determinants of Health   Financial Resource Strain:   . Difficulty of Paying Living Expenses: Not on file  Food Insecurity:   . Worried About Programme researcher, broadcasting/film/video in the Last Year: Not on file  . Ran Out of Food in  the Last Year: Not on file  Transportation Needs:   . Lack of Transportation (Medical): Not on file  . Lack of Transportation (Non-Medical): Not on file  Physical Activity:   . Days of Exercise per Week: Not on file  . Minutes of Exercise per Session: Not on file  Stress:   . Feeling of Stress : Not on file  Social Connections:   . Frequency of Communication with Friends and Family: Not on file  . Frequency of Social Gatherings with Friends and Family: Not on file  . Attends Religious Services: Not on file  . Active Member of Clubs or Organizations: Not on file  . Attends Banker Meetings: Not on file  . Marital Status: Not on file   Family History  Problem Relation Age of Onset  . Hypertension Mother   . Hypertension Father    Scheduled Meds: . chlordiazePOXIDE  25 mg Oral QID  . Chlorhexidine Gluconate Cloth  6 each Topical Daily  . enoxaparin (LOVENOX) injection  0.5 mg/kg Subcutaneous Q24H  . folic acid  1 mg Oral Daily  . lactulose  30 g Oral TID  .  LORazepam  0-4 mg Intravenous Q4H   Followed by  . [START ON 06/13/2020] LORazepam  0-4 mg Intravenous Q8H  . multivitamin with minerals  1 tablet Oral Daily  . pantoprazole sodium  40 mg Oral QHS  . thiamine  100 mg Oral Daily   Continuous Infusions: . sodium chloride Stopped (06/08/20 1037)  . dexmedetomidine (PRECEDEX) IV infusion 0.6 mcg/kg/hr (06/11/20 1502)  . dextrose 5 % with KCl 20 mEq / L 20 mEq (06/10/20 1816)   PRN Meds:.sodium chloride, ondansetron **OR** ondansetron (ZOFRAN) IV Medications Prior to Admission:  Prior to Admission medications   Medication Sig Start Date End Date Taking? Authorizing Provider  CONSTULOSE 10 GM/15ML solution Take 30 mLs by mouth in the morning, at noon, in the evening, and at bedtime. 05/28/20  Yes [provider]  furosemide (LASIX) 40 MG tablet Take 40 mg by mouth daily.    Yes [provider]  magnesium oxide (MAG-OX) 400 (241.3 Mg) MG tablet Take 1 tablet by mouth 2 (two) times daily. 05/23/20  Yes [provider]  pantoprazole (PROTONIX) 40 MG tablet Take 40 mg by mouth daily. 04/17/20  Yes [provider]  potassium chloride (KLOR-CON) 10 MEQ tablet Take 10 mEq by mouth daily. 05/23/20  Yes [provider]  propranolol (INDERAL) 20 MG tablet Take 20 mg by mouth daily. 04/17/20  Yes [provider]  spironolactone (ALDACTONE) 25 MG tablet Take 25 mg by mouth in the morning, at noon, in the evening, and at bedtime.  05/08/20  Yes [provider]  thiamine 100 MG tablet Take 1 tablet (100 mg total) by mouth daily. 11/20/18  Yes Gouru, Aruna, MD  lidocaine (LIDODERM) 5 % Place 1 patch onto the skin daily. 05/05/20   [provider]   No Known Allergies Review of Systems  Unable to perform ROS   Physical Exam Constitutional:      Comments: Eyes closed.   Pulmonary:     Comments: Even and unlabored respirations.     Vital Signs: BP (!) 128/104   Pulse 61   Temp 97.6  F (36.4 C) (Axillary)   Resp 15   Ht 5' 7.01" (1.702 m)   Wt 95.2 kg   SpO2 97%   BMI 32.86 kg/m  Pain Scale: Faces   Pain Score: 0-No pain  SpO2: SpO2: 97 % O2 Device:SpO2: 97 % O2 Flow Rate: .   IO: Intake/output summary:   Intake/Output Summary (Last 24 hours) at 06/11/2020 1555 Last data filed at 06/11/2020 1200 Gross per 24 hour  Intake 1221.79 ml  Output 200 ml  Net 1021.79 ml    LBM: Last BM Date: 06/13/2020 Baseline Weight: Weight: 97.7 kg Most recent weight: Weight: 95.2 kg     Palliative Assessment/Data:     Time In: 3:30 Time Out: 4:00  Time Total: 30 min Greater than 50%  of this time was spent counseling and coordinating care related to the above assessment and plan.  Signed by: Morton Stall, NP   Please contact Palliative Medicine Team phone at [phone] for questions and concerns.  For individual provider: See Loretha Stapler

## 2020-06-11 NOTE — Progress Notes (Signed)
Precedex off since 2123 no aggression or issues with patient all night. Only responds to pain. I have given no sedatives and have not been able to give PO meds due to sedation.

## 2020-06-11 NOTE — Progress Notes (Signed)
CRITICAL CARE NOTE Brief History   49 year old male with ETOH abuse and alcoholic liver cirrhosis admitted 11/12 for hepatic encephalopathy and possible alcohol withdrawal after non-compliance with home medications and ongoing ETOH abuse.  On 11/15, became more agitated and combative despite ativan, librium, and haldol therefore transferred to the ICU and started on precedex.   History of present illness   HPI obtained from medical chart review as patient is encephalopathic.   49 year old male with prior history of ETOH abuse, alcoholic liver cirrhosis, SBP, esophageal varices, DM, ventral and inguinal hernia admitted on 11/12 to [facility] with hepatic encephalopathy.    Recently admitted at [facility]  10/19- 10/22 for decompensated cirrhosis and again on 10/29- 11/1 for hepatic encephalopathy and and decompensated cirrhosis.  He was discharged home however was noncompliant with his medications and continued to drink alcohol.   He presented back with worsening confusion and agitation.  He admitted to [facility] for hepatic encephalopathy, possible alcohol withdrawal, and empircally started on SBP coverage.  He was restarted on his home medications and CIWA with ativan.  ETOH was negative on admit.  Hospital course with confusion slowly improving, however course complicated by a fall out of bed on 11/14 with negative CT head.  He continued on ativan CIWA, librium added 11/15, however he became more agitated and combative despite ativan, librium, and haldol.  He was therefore transferred to the ICU and started on precedex infusion.  He remains afebrile and hemodynamically stable otherwise.  PCCM consulted for further ICU management.   Past Medical History  Alcoholic liver cirrhosis, ascites and SBP, esophageal varices (grade 1), DM, ETOH abuse, ventral and inguinal hernia  Significant Hospital Events   11/12 admitted 11/15 tx ICU for precedex   CC  follow up liver failure   SUBJECTIVE Patient remains critically  ill Prognosis is guarded   BP 104/62   Pulse 61   Temp (!) 96.3 F (35.7 C) (Rectal)   Resp 15   Ht 5' 7.01" (1.702 m)   Wt 95.2 kg   SpO2 97%   BMI 32.86 kg/m    I/O last 3 completed shifts: In: 258.7 [P.O.:90; I.V.:168.7] Out: 400 [Urine:400] No intake/output data recorded.  SpO2: 97 %  Estimated body mass index is 32.86 kg/m as calculated from the following:   Height as of this encounter: 5' 7.01" (1.702 m).   Weight as of this encounter: 95.2 kg.  SIGNIFICANT EVENTS   REVIEW OF SYSTEMS  PATIENT IS UNABLE TO PROVIDE COMPLETE REVIEW OF SYSTEMS DUE TO SEVERE CRITICAL ILLNESS        PHYSICAL EXAMINATION:  GENERAL:critically ill appearing,  HEAD: Normocephalic, atraumatic.  EYES: + scleral icterus.  MOUTH: Moist mucosal membrane. NECK: Supple.  PULMONARY: +rhonchi, +wheezing CARDIOVASCULAR: S1 and S2. Regular rate and rhythm. No murmurs, rubs, or gallops.  GASTROINTESTINAL: Soft, nontender, -distended.  Positive bowel sounds.   MUSCULOSKELETAL: +edema.  NEUROLOGIC: obtunded, GCS<8 SKIN:intact,warm,dry  MEDICATIONS: I have reviewed all medications and confirmed regimen as documented   CULTURE RESULTS   Recent Results (from the past 240 hour(s))  Respiratory Panel by RT PCR (Flu A&B, Covid) - Nasopharyngeal Swab     Status: None   Collection Time: July 04, 2020  8:58 AM   Specimen: Nasopharyngeal Swab  Result Value Ref Range Status   SARS Coronavirus 2 by RT PCR NEGATIVE NEGATIVE Final    Comment: (NOTE) SARS-CoV-2 target nucleic acids are NOT DETECTED.  The SARS-CoV-2 RNA is generally detectable in upper respiratoy specimens  during the acute phase of infection. The lowest concentration of SARS-CoV-2 viral copies this assay can detect is 131 copies/mL. A negative result does not preclude SARS-Cov-2 infection and should not be used as the sole basis for treatment or other patient management decisions. A negative result may occur with  improper specimen  collection/handling, submission of specimen other than nasopharyngeal swab, presence of viral mutation(s) within the areas targeted by this assay, and inadequate number of viral copies (<131 copies/mL). A negative result must be combined with clinical observations, patient history, and epidemiological information. The expected result is Negative.  Fact Sheet for Patients:  https://www.moore.com/  Fact Sheet for Healthcare Providers:  https://www.young.biz/  This test is no t yet approved or cleared by the [place] FDA and  has been authorized for detection and/or diagnosis of SARS-CoV-2 by FDA under an Emergency Use Authorization (EUA). This EUA will remain  in effect (meaning this test can be used) for the duration of the COVID-19 declaration under Section 564(b)(1) of the Act, 21 U.S.C. section 360bbb-3(b)(1), unless the authorization is terminated or revoked sooner.     Influenza A by PCR NEGATIVE NEGATIVE Final   Influenza B by PCR NEGATIVE NEGATIVE Final    Comment: (NOTE) The Xpert Xpress SARS-CoV-2/FLU/RSV assay is intended as an aid in  the diagnosis of influenza from Nasopharyngeal swab specimens and  should not be used as a sole basis for treatment. Nasal washings and  aspirates are unacceptable for Xpert Xpress SARS-CoV-2/FLU/RSV  testing.  Fact Sheet for Patients: https://www.moore.com/  Fact Sheet for Healthcare Providers: https://www.young.biz/  This test is not yet approved or cleared by the [place] FDA and  has been authorized for detection and/or diagnosis of SARS-CoV-2 by  FDA under an Emergency Use Authorization (EUA). This EUA will remain  in effect (meaning this test can be used) for the duration of the  Covid-19 declaration under Section 564(b)(1) of the Act, 21  U.S.C. section 360bbb-3(b)(1), unless the authorization is  terminated or revoked. Performed at [facility], [address] [place], [place] [address]   MRSA PCR Screening     Status: None   Collection Time: 06/10/20  6:20 PM   Specimen: Nasal Mucosa; Nasopharyngeal  Result Value Ref Range Status   MRSA by PCR NEGATIVE NEGATIVE Final    Comment:        The GeneXpert MRSA Assay (FDA approved for NASAL specimens only), is one component of a comprehensive MRSA colonization surveillance program. It is not intended to diagnose MRSA infection nor to guide or monitor treatment for MRSA infections. Performed at [facility], [address], [place], [place] [address]           IMAGING    No results found.   Nutrition Status:           Indwelling Urinary Catheter continued, requirement due to   Reason to continue Indwelling Urinary Catheter strict Intake/Output monitoring for hemodynamic instability   Central Line/ continued, requirement due to  Reason to continue [employer] Monitoring of central venous pressure or other hemodynamic parameters and poor IV access   Ventilator continued, requirement due to severe respiratory failure   Ventilator Sedation RASS 0 to -2      ASSESSMENT AND PLAN SYNOPSIS  PROGRESSIVE LIVER FAILURE LEADING TO HEPATIC ENCECHAPLOPATHY HIGH RISK FOR INTUBATION AND CARDIAC ARREST   NEUROLOGY Acute toxic metabolic encephalopathy   DIET-->NPO Constipation protocol as indicated  ENDO - will use ICU hypoglycemic\Hyperglycemia protocol if  indicated     ELECTROLYTES -follow labs as needed -replace as needed -pharmacy consultation and following   DVT/GI PRX ordered and assessed TRANSFUSIONS AS NEEDED MONITOR FSBS I Assessed the need for Labs I Assessed the need for Foley I Assessed the need for Central Venous Line Family Discussion when available I Assessed the need for Mobilization I made an Assessment of medications to be adjusted accordingly Safety Risk assessment completed   CASE DISCUSSED IN  MULTIDISCIPLINARY ROUNDS WITH ICU TEAM  Critical Care Time devoted to patient care services described in this note is 45 minutes.   Overall, patient is critically ill, prognosis is guarded.  Patient with Multiorgan failure and at high risk for cardiac arrest and death.   Will obtain Palliative care consultation   Lucie Leather, M.D.  [facility]  Medical Director [facility] Medical Director [facility] Cardio-Pulmonary Department

## 2020-06-11 NOTE — Progress Notes (Signed)
GOALS OF CARE DISCUSSION  The Clinical status was relayed to family in detail-Son [place]  Updated and notified of patients medical condition.  Patient remains unresponsive and will not open eyes to command.   Upon assessment his breath sounds are course crackles with significant secretions to oral pharyngeal region. Nasopharyngeal suction produced copious  secretions.    Patient is having a weak cough and struggling to remove secretions.   patient with increased WOB and using accessory muscles to breathe Explained to family course of therapy and the modalities     Patient with Progressive multiorgan failure with very low chance of meaningful recovery despite all aggressive and optimal medical therapy.   Family understands the situation.  They have consented and agreed to DNR/DNI after we proceed with intubation  Family are satisfied with Plan of action and management. All questions answered  Additional CC time 32 mins   Dara Camargo Santiago Glad, M.D.  [facility]  Medical Director [facility] Medical Director [facility] Cardio-Pulmonary Department

## 2020-06-11 NOTE — Procedures (Signed)
Endotracheal Intubation: Patient required placement of an artificial airway secondary to Respiratory Failure  Consent: Emergent.   Hand washing performed prior to starting the procedure.   Medications administered for sedation prior to procedure:  Midazolam 4 mg IV,  Vecuronium 10 mg IV, Fentanyl 200 mcg IV. ETOMIDATE 20 mg   A time out procedure was called and correct patient, name, & ID confirmed. Needed supplies and equipment were assembled and checked to include ETT, 10 ml syringe, Glidescope, Mac and Miller blades, suction, oxygen and bag mask valve, end tidal CO2 monitor.   Patient was positioned to align the mouth and pharynx to facilitate visualization of the glottis.   Heart rate, SpO2 and blood pressure was continuously monitored during the procedure. Pre-oxygenation was conducted prior to intubation and endotracheal tube was placed through the vocal cords into the trachea.     The artificial airway was placed under direct visualization via glidescope route using a 8.5 ETT on the first attempt.  ETT was secured at 23 cm mark.  Placement was confirmed by auscuitation of lungs with good breath sounds bilaterally and no stomach sounds.  Condensation was noted on endotracheal tube.   Pulse ox 98%.  CO2 detector in place with appropriate color change.   Complications: None .   Operator: Visual merchandiser.   Chest radiograph ordered and pending.   Comments: OGT placed via glidescope.  Corrin Parker, M.D.  [facility]  Medical Director [place] Director [facility] Cardio-Pulmonary Department

## 2020-06-11 NOTE — TOC Progression Note (Signed)
Transition of Care [facility]) - Progression Note    Patient Details  Name: Daniel Walton MRN: [id number] Date of Birth: 02/07/1971  Transition of Care [facility]) CM/SW Contact  Marina Goodell Phone Number: [phone] 06/11/2020, 9:37 AM  Clinical Narrative:     CSW acknowledges consult for SA resources. Patient only oriented to self. Per RN note this morning, patient agitated and combative. Will follow for improvement in mental status before providing resources.       Expected Discharge Plan and Services                                                 Social Determinants of Health (SDOH) Interventions    Readmission Risk Interventions No flowsheet data found.

## 2020-06-11 NOTE — Progress Notes (Signed)
  Precedex gtt off since 2123 due to oversedation.  Not needed throughout the night. Will restart CIWA- stepdown protocol with ativan and resume librium when safely able to take POs     Posey Boyer, ACNP [place] [facility] 06/11/2020, 3:50 AM

## 2020-06-11 NOTE — Progress Notes (Signed)
Dr Belia Heman notified of pt's spitting up, unable to protect airway, Dr Belia Heman has called family and notifed of the change of status and need for intubation. Son agrees to intubate pt but change code status to DNR. Pt intubated at 1805, no complications or distress noted during intubation, NG tube and foley placed as well.

## 2020-06-12 DIAGNOSIS — J9601 Acute respiratory failure with hypoxia: Secondary | ICD-10-CM

## 2020-06-12 LAB — CBC WITH DIFFERENTIAL/PLATELET
Abs Immature Granulocytes: 0.03 10*3/uL (ref 0.00–0.07)
Basophils Absolute: 0 10*3/uL (ref 0.0–0.1)
Basophils Relative: 1 %
Eosinophils Absolute: 0.3 10*3/uL (ref 0.0–0.5)
Eosinophils Relative: 3 %
HCT: 32 % — ABNORMAL LOW (ref 39.0–52.0)
Hemoglobin: 10.2 g/dL — ABNORMAL LOW (ref 13.0–17.0)
Immature Granulocytes: 0 %
Lymphocytes Relative: 12 %
Lymphs Abs: 1 10*3/uL (ref 0.7–4.0)
MCH: 33.2 pg (ref 26.0–34.0)
MCHC: 31.9 g/dL (ref 30.0–36.0)
MCV: 104.2 fL — ABNORMAL HIGH (ref 80.0–100.0)
Monocytes Absolute: 1.1 10*3/uL — ABNORMAL HIGH (ref 0.1–1.0)
Monocytes Relative: 14 %
Neutro Abs: 5.7 10*3/uL (ref 1.7–7.7)
Neutrophils Relative %: 70 %
Platelets: 98 10*3/uL — ABNORMAL LOW (ref 150–400)
RBC: 3.07 MIL/uL — ABNORMAL LOW (ref 4.22–5.81)
RDW: 18 % — ABNORMAL HIGH (ref 11.5–15.5)
WBC: 8.1 10*3/uL (ref 4.0–10.5)
nRBC: 0 % (ref 0.0–0.2)

## 2020-06-12 LAB — COMPREHENSIVE METABOLIC PANEL
ALT: 22 U/L (ref 0–44)
AST: 71 U/L — ABNORMAL HIGH (ref 15–41)
Albumin: 1.6 g/dL — ABNORMAL LOW (ref 3.5–5.0)
Alkaline Phosphatase: 84 U/L (ref 38–126)
Anion gap: 10 (ref 5–15)
BUN: 24 mg/dL — ABNORMAL HIGH (ref 6–20)
CO2: 19 mmol/L — ABNORMAL LOW (ref 22–32)
Calcium: 8.2 mg/dL — ABNORMAL LOW (ref 8.9–10.3)
Chloride: 108 mmol/L (ref 98–111)
Creatinine, Ser: 1.27 mg/dL — ABNORMAL HIGH (ref 0.61–1.24)
GFR, Estimated: >60 mL/min (ref >60)
Glucose, Bld: 97 mg/dL (ref 70–99)
Potassium: 3.8 mmol/L (ref 3.5–5.1)
Sodium: 137 mmol/L (ref 135–145)
Total Bilirubin: 3.8 mg/dL — ABNORMAL HIGH (ref 0.3–1.2)
Total Protein: 6.4 g/dL — ABNORMAL LOW (ref 6.5–8.1)

## 2020-06-12 LAB — BLOOD GAS, ARTERIAL
Acid-base deficit: 3.6 mmol/L — ABNORMAL HIGH (ref 0.0–2.0)
Bicarbonate: 20.3 mmol/L (ref 20.0–28.0)
FIO2: 0.4
MECHVT: 500 mL
Mechanical Rate: 12
O2 Saturation: 97.5 %
PEEP: 5 cmH2O
Patient temperature: 37
pCO2 arterial: 32 mmHg (ref 32.0–48.0)
pH, Arterial: 7.41 (ref 7.350–7.450)
pO2, Arterial: 95 mmHg (ref 83.0–108.0)

## 2020-06-12 LAB — GLUCOSE, CAPILLARY
Glucose-Capillary: 115 mg/dL — ABNORMAL HIGH (ref 70–99)
Glucose-Capillary: 79 mg/dL (ref 70–99)
Glucose-Capillary: 80 mg/dL (ref 70–99)
Glucose-Capillary: 81 mg/dL (ref 70–99)
Glucose-Capillary: 84 mg/dL (ref 70–99)
Glucose-Capillary: 86 mg/dL (ref 70–99)

## 2020-06-12 LAB — TRIGLYCERIDES: Triglycerides: 52 mg/dL (ref <150)

## 2020-06-12 LAB — MAGNESIUM: Magnesium: 1.8 mg/dL (ref 1.7–2.4)

## 2020-06-12 LAB — PHOSPHORUS: Phosphorus: 4.5 mg/dL (ref 2.5–4.6)

## 2020-06-12 MED ORDER — SODIUM CHLORIDE 0.9 % IV SOLN: 250.0000 mL | INTRAVENOUS | Status: DC

## 2020-06-12 MED ORDER — POLYETHYLENE GLYCOL 3350 17 G PO PACK
17.0000 g | PACK | Freq: Every day | ORAL | Status: DC
  Administered 2020-06-12: 17 g
  Filled 2020-06-12 (×2): qty 1

## 2020-06-12 MED ORDER — FOLIC ACID 1 MG PO TABS
1.0000 mg | ORAL_TABLET | Freq: Every day | ORAL | Status: DC
  Administered 2020-06-12: 1 mg
  Filled 2020-06-12 (×2): qty 1

## 2020-06-12 MED ORDER — ADULT MULTIVITAMIN W/MINERALS CH
1.0000 | ORAL_TABLET | Freq: Every day | ORAL | Status: DC
  Administered 2020-06-12: 1
  Filled 2020-06-12 (×2): qty 1

## 2020-06-12 MED ORDER — VITAL AF 1.2 CAL PO LIQD
1000.0000 mL | ORAL | Status: DC
  Administered 2020-06-12: 1000 mL

## 2020-06-12 MED ORDER — THIAMINE HCL 100 MG PO TABS
100.0000 mg | ORAL_TABLET | Freq: Every day | ORAL | Status: DC
  Administered 2020-06-12: 100 mg
  Filled 2020-06-12 (×2): qty 1

## 2020-06-12 MED ORDER — NOREPINEPHRINE 4 MG/250ML-% IV SOLN
2.0000 ug/min | INTRAVENOUS | Status: DC
  Administered 2020-06-12: 4 ug/min via INTRAVENOUS
  Filled 2020-06-12: qty 250

## 2020-06-12 MED ORDER — DOCUSATE SODIUM 50 MG/5ML PO LIQD
100.0000 mg | Freq: Two times a day (BID) | ORAL | Status: DC
  Administered 2020-06-12 – 2020-06-13 (×3): 100 mg
  Filled 2020-06-12 (×3): qty 10

## 2020-06-12 MED ORDER — PROSOURCE TF PO LIQD
90.0000 mL | Freq: Four times a day (QID) | ORAL | Status: DC
  Administered 2020-06-12 – 2020-06-13 (×4): 90 mL
  Filled 2020-06-12: qty 90

## 2020-06-12 MED ORDER — PANTOPRAZOLE SODIUM 40 MG PO PACK
40.0000 mg | PACK | Freq: Every day | ORAL | Status: DC
  Administered 2020-06-12: 40 mg
  Filled 2020-06-12: qty 20

## 2020-06-12 MED ORDER — VITAL HIGH PROTEIN PO LIQD: 1000.0000 mL | ORAL | Status: DC

## 2020-06-12 MED ORDER — LACTULOSE 10 GM/15ML PO SOLN
30.0000 g | Freq: Three times a day (TID) | ORAL | Status: DC
  Administered 2020-06-12 (×3): 30 g
  Filled 2020-06-12 (×4): qty 60

## 2020-06-12 MED ORDER — NOREPINEPHRINE 4 MG/250ML-% IV SOLN
INTRAVENOUS | Status: AC
  Administered 2020-06-12: 2 ug/min via INTRAVENOUS
  Filled 2020-06-12: qty 250

## 2020-06-12 MED ORDER — CHLORDIAZEPOXIDE HCL 25 MG PO CAPS
25.0000 mg | ORAL_CAPSULE | Freq: Four times a day (QID) | ORAL | Status: DC
  Administered 2020-06-12 (×4): 25 mg
  Filled 2020-06-12 (×5): qty 1

## 2020-06-12 NOTE — Progress Notes (Signed)
Initial Nutrition Assessment  DOCUMENTATION CODES:   Obesity unspecified  INTERVENTION:  Initiate Vital AF 1.2 Cal at 30 mL/hr (720 mL goal daily volume) + PROSource TF 90 mL QID per tube. Provides 1184 kcal, 142 grams of protein, 583 mL H2O daily. With current propofol rate provides 1334 kcal daily.  Continue MVI daily per tube, thiamine 100 mg daily per tube, and folic acid 1 mg daily per tube.  Monitor magnesium, potassium, and phosphorus daily for at least 3 days, MD to replete as needed, as pt is at risk for refeeding syndrome.  NUTRITION DIAGNOSIS:   Inadequate oral intake related to inability to eat as evidenced by NPO status.  GOAL:   Patient will meet greater than or equal to 90% of their needs  MONITOR:   Vent status, Labs, Weight trends, TF tolerance, I & O's  REASON FOR ASSESSMENT:   Ventilator, Consult Enteral/tube feeding initiation and management  ASSESSMENT:   49 year old male with PMHx of anxiety, EtOH abuse, alcoholic liver cirrhosis admitted on 11/12 for hepatic encephalopathy and possible alcohol withdrawal.   11/15 transferred to ICU and started on Precedex gtt due to agitation 11/16 intubated  Patient is currently intubated on ventilator support MV: 6.8 L/min Temp (24hrs), Avg:95.1 F (35.1 C), Min:89.4 F (31.9 C), Max:99.5 F (37.5 C)  Propofol: 5.7 ml/hr (150 kcal daily)  Medications reviewed and include: Colace 100 mg BID, folic acid 1 mg daily, lactulose 30 grams TID, MVI daily, Protonix, Miralax, thiamine 100 mg daily, D5 with KCl 20 mEq/L at 50 mL/hr, fentanyl gtt, norepinephrine gtt at 4 mcg/min, propofol gtt.  Labs reviewed: CBG 86-118, CO2 19, BUN 24, Creatinine 1.27.  I/O: 1025 mL UOP Yesterday (0.4 mL/kg/hr)  Weight trend: 95.2 kg on 11/12 (209.88 lbs); last wt in chart was from >1 year ago of 100.7 kg on 11/18/2018  Enteral Access: NGT placed 11/16; terminates in body of stomach per abdominal x-ray 11/16  Discussed with RN and  on rounds. Plan is to start tube feeds today.   NUTRITION - FOCUSED PHYSICAL EXAM:    Most Recent Value  Orbital Region No depletion  Upper Arm Region No depletion  Thoracic and Lumbar Region No depletion  Buccal Region Unable to assess  Temple Region No depletion  Clavicle Bone Region No depletion  Clavicle and Acromion Bone Region No depletion  Scapular Bone Region Unable to assess  Dorsal Hand No depletion  Patellar Region No depletion  Anterior Thigh Region No depletion  Posterior Calf Region No depletion  Edema (RD Assessment) Mild  Hair Reviewed  Eyes Unable to assess  Mouth Unable to assess  Skin Reviewed  Nails Reviewed     Diet Order:   Diet Order            Diet NPO time specified  Diet effective now                EDUCATION NEEDS:   No education needs have been identified at this time  Skin:  Skin Assessment: Reviewed RN Assessment  Last BM:  06-26-20 per chart  Height:   Ht Readings from Last 1 Encounters:  06-26-2020 5' 7.01" (1.702 m)   Weight:   Wt Readings from Last 1 Encounters:  2020-06-26 95.2 kg   Ideal Body Weight:  67.3 kg  BMI:  Body mass index is 32.86 kg/m.  Estimated Nutritional Needs:   Kcal:  0350-0938 (11-14 kcal/kg)  Protein:  135 grams (2 grams/kg IBW)  Fluid:  2 L/day  Felix Pacini, MS, RD, LDN Pager number available on Amion

## 2020-06-12 NOTE — TOC Progression Note (Signed)
Transition of Care [facility]) - Progression Note    Patient Details  Name: Daniel Walton MRN: [id number] Date of Birth: 1970/10/29  Transition of Care [facility]) CM/SW [place], [place] Phone Number: [phone] 06/12/2020, 3:43 PM  Clinical Narrative:     Patient's son Daniel Walton. [phone], HCPOA, met with palliative care RN, and has agreed to DNR for patient.  Goals of care are one way extubation to comfort care.       Expected Discharge Plan and Services                                                 Social Determinants of Health (SDOH) Interventions    Readmission Risk Interventions No flowsheet data found.

## 2020-06-12 NOTE — Progress Notes (Addendum)
Daily Progress Note   Patient Name: Daniel Walton       Date: 06/12/2020 DOB: 15-Mar-1971  Age: 49 y.o. MRN#: [id number] Attending Physician: Flora Lipps, MD Primary Care Physician: Angelene Giovanni Primary Care Admit Date: 05/31/2020  Reason for Consultation/Follow-up: Establishing goals of care  Subjective: Patient is resting in bed. He is now on ventilator support. Met with son at bedside. He has brought HPOA papers, and is primary HPOA.  We discussed his diagnosis, prognosis, GOC, EOL wishes disposition and options.  Created space and opportunity for patient  to explore thoughts and feelings regarding current medical information.   Son discusses frustration with the fluidity of health information, that at one point his is stable, later is possibly dying without ventilator support, and then stable and doing fine. Discussed the developed need for airway control, and then stability once the airway was controlled.    He discusses his father's desire to not take medications or stop drinking alcohol. He discusses his father's agressive behavior when his ammonia increases.  Discussed limitations of medical interventions to prolong quality of life in some situations and discussed the concept of human mortality. His son states he understands that changes in the hospitalization are fluid, and upon going home, patient would decline. He states his father would not want to have someone living with him, or to have to live in a facility.   A detailed discussion was had today regarding advanced directives.  Concepts specific to code status, artifical feeding and hydration, IV antibiotics and rehospitalization were discussed.  The difference between an aggressive medical intervention path and a comfort  care path was discussed.  Values and goals of care important to patient and family were attempted to be elicited.   He discusses that his father has only been out of [facility] for about 2 weeks. He states he does not want his father to remain in a cycle of hospitalizations. He states he would want to focus on comfort and dignity for what time his father has left. He states he will have family come to spend time with him, and will then shift his focus to comfort with one-way extubation.   He called patient's sister to discuss plans with her. He then requested I explain diagnosis, prognosis, and comfort care while they are on speaker phone together. All  questions were answered, and she supports patient' son's decision.     Patient's son verbalizes understanding and appreciation of detailed discussion.    Educated patient on natural trajectory of illness, and expectations at EOL were discussed.  Questions and concerns addressed.        Length of Stay: 5  Current Medications: Scheduled Meds:  . chlordiazePOXIDE  25 mg Per Tube QID  . chlorhexidine gluconate (MEDLINE KIT)  15 mL Mouth Rinse BID  . Chlorhexidine Gluconate Cloth  6 each Topical Daily  . docusate  100 mg Per Tube BID  . enoxaparin (LOVENOX) injection  0.5 mg/kg Subcutaneous Q24H  . folic acid  1 mg Per Tube Daily  . lactulose  30 g Per Tube TID  . LORazepam  0-4 mg Intravenous Q4H   Followed by  . [START ON 06/13/2020] LORazepam  0-4 mg Intravenous Q8H  . mouth rinse  15 mL Mouth Rinse 10 times per day  . multivitamin with minerals  1 tablet Per Tube Daily  . pantoprazole sodium  40 mg Per Tube QHS  . polyethylene glycol  17 g Per Tube Daily  . thiamine  100 mg Per Tube Daily    Continuous Infusions: . sodium chloride Stopped (06/08/20 1037)  . sodium chloride    . dextrose 5 % with KCl 20 mEq / L 50 mL/hr at 06/12/20 0600  . DOPamine 10 mcg/kg/min (06/12/20 0040)  . fentaNYL infusion INTRAVENOUS 50 mcg/hr (06/12/20 0600)    . norepinephrine (LEVOPHED) Adult infusion 4 mcg/min (06/12/20 0600)  . propofol (DIPRIVAN) infusion 10 mcg/kg/min (06/12/20 0940)    PRN Meds: sodium chloride, atropine, fentaNYL, midazolam, midazolam, ondansetron **OR** ondansetron (ZOFRAN) IV  Physical Exam Constitutional:      Comments: On ventilator.              Vital Signs: BP 94/60   Pulse 81   Temp 99 F (37.2 C) (Esophageal)   Resp 11   Ht 5' 7.01" (1.702 m)   Wt 95.2 kg   SpO2 100%   BMI 32.86 kg/m  SpO2: SpO2: 100 % O2 Device: O2 Device: Ventilator O2 Flow Rate:    Intake/output summary:   Intake/Output Summary (Last 24 hours) at 06/12/2020 1106 Last data filed at 06/12/2020 0600 Gross per 24 hour  Intake 3045.22 ml  Output 1025 ml  Net 2020.22 ml   LBM: Last BM Date: 06/10/2020 Baseline Weight: Weight: 97.7 kg Most recent weight: Weight: 95.2 kg       Palliative Assessment/Data:      Patient Active Problem List   Diagnosis Date Noted  . Hepatic encephalopathy ([place]) 06/10/2020  . Alcoholic cirrhosis of liver with ascites ([place]) 06/22/2020  . Alcohol dependence ([place]) 06/12/2020  . Seizures ([place]) 11/18/2018    Palliative Care Assessment & Plan     Recommendations/Plan:  Family to visit patient then transition to comfort care with one way extubation. Likely not today. Continue current tx until that time.   Code Status:    Code Status Orders  (From admission, onward)         Start     Ordered   06/11/20 1756  Do not attempt resuscitation (DNR)  Continuous       Question Answer Comment  In the event of cardiac or respiratory ARREST Do not call a "code blue"   In the event of cardiac or respiratory ARREST Do not perform Intubation, CPR, defibrillation or ACLS   In the event of cardiac or respiratory  ARREST Use medication by any route, position, wound care, and other measures to relive pain and suffering. May use oxygen, suction and manual treatment of airway obstruction as needed for  comfort.      06/11/20 1756        Code Status History    Date Active Date Inactive Code Status Order ID Comments User Context   06/25/2020 0943 06/11/2020 1756 Full Code [id number]  Collier Bullock, MD ED   11/18/2018 1115 11/19/2018 1608 Full Code [id number]  Nicholes Mango, MD ED   Advance Care Planning Activity       Prognosis:  Poor   Care plan was discussed with RN  Thank you for allowing the Palliative Medicine Team to assist in the care of this patient.   Total Time 60 min Prolonged Time Billed  no       Greater than 50%  of this time was spent counseling and coordinating care related to the above assessment and plan.  Asencion Gowda, NP  Please contact Palliative Medicine Team phone at [phone] for questions and concerns.

## 2020-06-12 NOTE — Progress Notes (Signed)
CRITICAL CARE NOTE 49 year old male with ETOH abuse and alcoholic liver cirrhosis admitted 11/12 for hepatic encephalopathy and possible alcohol withdrawal after non-compliance with home medications and ongoing ETOH abuse.  On 11/15, became more agitated and combative despite ativan, librium, and haldol therefore transferred to the ICU and started on precedex.   History of present illness   HPI obtained from medical chart review as patient is encephalopathic.   49 year old male with prior history of ETOH abuse, alcoholic liver cirrhosis, SBP, esophageal varices, DM, ventral and inguinal hernia admitted on 11/12 to [facility] with hepatic encephalopathy.    Recently admitted at [facility]  10/19- 10/22 for decompensated cirrhosis and again on 10/29- 11/1 for hepatic encephalopathy and and decompensated cirrhosis.  He was discharged home however was noncompliant with his medications and continued to drink alcohol.   He presented back with worsening confusion and agitation.  He admitted to [facility] for hepatic encephalopathy, possible alcohol withdrawal, and empircally started on SBP coverage.  He was restarted on his home medications and CIWA with ativan.  ETOH was negative on admit.  Hospital course with confusion slowly improving, however course complicated by a fall out of bed on 11/14 with negative CT head.  He continued on ativan CIWA, librium added 11/15, however he became more agitated and combative despite ativan, librium, and haldol.  He was therefore transferred to the ICU and started on precedex infusion.  He remains afebrile and hemodynamically stable otherwise.  PCCM consulted for further ICU management.   Past Medical History  Alcoholic liver cirrhosis, ascites and SBP, esophageal varices (grade 1), DM, ETOH abuse, ventral and inguinal hernia  Significant Hospital Events   11/12 admitted 11/15 tx ICU for precedex 11/16 patient emergently intubated, [patient made DNR  CC  follow up respiratory  failure  SUBJECTIVE Patient remains critically ill Prognosis is guarded   BP 94/60   Pulse 81   Temp 99 F (37.2 C) (Esophageal)   Resp 11   Ht 5' 7.01" (1.702 m)   Wt 95.2 kg   SpO2 100%   BMI 32.86 kg/m    I/O last 3 completed shifts: In: 3045.2 [I.V.:2044.7; IV Piggyback:1000.5] Out: 1225 [Urine:1225] No intake/output data recorded.  SpO2: 100 % FiO2 (%): 40 %  Estimated body mass index is 32.86 kg/m as calculated from the following:   Height as of this encounter: 5' 7.01" (1.702 m).   Weight as of this encounter: 95.2 kg.  SIGNIFICANT EVENTS   REVIEW OF SYSTEMS  PATIENT IS UNABLE TO PROVIDE COMPLETE REVIEW OF SYSTEMS DUE TO SEVERE CRITICAL ILLNESS        PHYSICAL EXAMINATION:  GENERAL:critically ill appearing, +resp distress HEAD: Normocephalic, atraumatic.  EYES: Pupils equal, round, reactive to light.  No scleral icterus.  MOUTH: Moist mucosal membrane. NECK: Supple.  PULMONARY: +rhonchi, +wheezing CARDIOVASCULAR: S1 and S2. Regular rate and rhythm. No murmurs, rubs, or gallops.  GASTROINTESTINAL: Soft, nontender, -distended.  Positive bowel sounds.   MUSCULOSKELETAL: No swelling, clubbing, or edema.  NEUROLOGIC: obtunded, GCS<8 SKIN:intact,warm,dry  MEDICATIONS: I have reviewed all medications and confirmed regimen as documented   CULTURE RESULTS   Recent Results (from the past 240 hour(s))  Respiratory Panel by RT PCR (Flu A&B, Covid) - Nasopharyngeal Swab     Status: None   Collection Time: 06/04/2020  8:58 AM   Specimen: Nasopharyngeal Swab  Result Value Ref Range Status   SARS Coronavirus 2 by RT PCR NEGATIVE NEGATIVE Final    Comment: (NOTE) SARS-CoV-2  target nucleic acids are NOT DETECTED.  The SARS-CoV-2 RNA is generally detectable in upper respiratoy specimens during the acute phase of infection. The lowest concentration of SARS-CoV-2 viral copies this assay can detect is 131 copies/mL. A negative result does not preclude  SARS-Cov-2 infection and should not be used as the sole basis for treatment or other patient management decisions. A negative result may occur with  improper specimen collection/handling, submission of specimen other than nasopharyngeal swab, presence of viral mutation(s) within the areas targeted by this assay, and inadequate number of viral copies (<131 copies/mL). A negative result must be combined with clinical observations, patient history, and epidemiological information. The expected result is Negative.  Fact Sheet for Patients:  https://www.moore.com/  Fact Sheet for Healthcare Providers:  https://www.young.biz/  This test is no t yet approved or cleared by the [place] FDA and  has been authorized for detection and/or diagnosis of SARS-CoV-2 by FDA under an Emergency Use Authorization (EUA). This EUA will remain  in effect (meaning this test can be used) for the duration of the COVID-19 declaration under Section 564(b)(1) of the Act, 21 U.S.C. section 360bbb-3(b)(1), unless the authorization is terminated or revoked sooner.     Influenza A by PCR NEGATIVE NEGATIVE Final   Influenza B by PCR NEGATIVE NEGATIVE Final    Comment: (NOTE) The Xpert Xpress SARS-CoV-2/FLU/RSV assay is intended as an aid in  the diagnosis of influenza from Nasopharyngeal swab specimens and  should not be used as a sole basis for treatment. Nasal washings and  aspirates are unacceptable for Xpert Xpress SARS-CoV-2/FLU/RSV  testing.  Fact Sheet for Patients: https://www.moore.com/  Fact Sheet for Healthcare Providers: https://www.young.biz/  This test is not yet approved or cleared by the [place] FDA and  has been authorized for detection and/or diagnosis of SARS-CoV-2 by  FDA under an Emergency Use Authorization (EUA). This EUA will remain  in effect (meaning this test can be used) for the duration of the   Covid-19 declaration under Section 564(b)(1) of the Act, 21  U.S.C. section 360bbb-3(b)(1), unless the authorization is  terminated or revoked. Performed at [facility], [address] [place], [place] [address]   MRSA PCR Screening     Status: None   Collection Time: 06/10/20  6:20 PM   Specimen: Nasal Mucosa; Nasopharyngeal  Result Value Ref Range Status   MRSA by PCR NEGATIVE NEGATIVE Final    Comment:        The GeneXpert MRSA Assay (FDA approved for NASAL specimens only), is one component of a comprehensive MRSA colonization surveillance program. It is not intended to diagnose MRSA infection nor to guide or monitor treatment for MRSA infections. Performed at [facility], [address], [place], [place] [address]           IMAGING    DG Chest 1 View  Result Date: 06/11/2020 CLINICAL DATA:  ET tube placement. EXAM: CHEST  1 VIEW COMPARISON:  Chest x-ray 11/18/2018. FINDINGS: The endotracheal tube is 2.2 cm above the carina. There is an NG tube coursing down the esophagus and into the stomach. The cardiac silhouette, mediastinal and hilar contours are within normal limits and stable. The lungs demonstrate mild central vascular congestion. Suspect small effusions and bibasilar atelectasis. IMPRESSION: 1. Endotracheal tube 2.2 cm above the carina. 2. NG tube in good position. 3. Suspect small effusions and bibasilar atelectasis. Electronically Signed   By: Rudie Meyer M.D.   On: 06/11/2020 19:32   DG Abd  1 View  Result Date: 06/11/2020 CLINICAL DATA:  NG tube placement. EXAM: ABDOMEN - 1 VIEW COMPARISON:  None. FINDINGS: The NG tube tip is in the body region of the stomach. Mild gaseous distention of the stomach. IMPRESSION: NG tube tip is in the body region of the stomach. Electronically Signed   By: Rudie Meyer M.D.   On: 06/11/2020 19:31     Nutrition Status:           Indwelling Urinary Catheter continued, requirement due to    Reason to continue Indwelling Urinary Catheter strict Intake/Output monitoring for hemodynamic instability   Central Line/ continued, requirement due to  Reason to continue [employer] Monitoring of central venous pressure or other hemodynamic parameters and poor IV access   Ventilator continued, requirement due to severe respiratory failure   Ventilator Sedation RASS 0 to -2      ASSESSMENT AND PLAN SYNOPSIS  PROGRESSIVE LIVER FAILURE LEADING TO HEPATIC ENCECHAPLOPATHY HIGH RISK FOR INTUBATION AND CARDIAC ARREST   Severe ACUTE Hypoxic and Hypercapnic Respiratory Failure -continue Full MV support -continue Bronchodilator Therapy -Wean Fio2 and PEEP as tolerated -VAP/VENT bundle implementation  ACUTE KIDNEY INJURY/Renal Failure -continue Foley Catheter-assess need -Avoid nephrotoxic agents -Follow urine output, BMP -Ensure adequate renal perfusion, optimize oxygenation -Renal dose medications     NEUROLOGY Acute toxic metabolic encephalopathy, need for sedation Goal RASS -2 to -3 +hepatic encephalopathy  CARDIAC ICU monitoring   GI GI PROPHYLAXIS as indicated  NUTRITIONAL STATUS Nutrition Status:         DIET-->TF's as tolerated Constipation protocol as indicated  ENDO - will use ICU hypoglycemic\Hyperglycemia protocol if indicated     ELECTROLYTES -follow labs as needed -replace as needed -pharmacy consultation and following   DVT/GI PRX ordered and assessed TRANSFUSIONS AS NEEDED MONITOR FSBS I Assessed the need for Labs I Assessed the need for Foley I Assessed the need for Central Venous Line Family Discussion when available I Assessed the need for Mobilization I made an Assessment of medications to be adjusted accordingly Safety Risk assessment completed   CASE DISCUSSED IN MULTIDISCIPLINARY ROUNDS WITH ICU TEAM  Critical Care Time devoted to patient care services described in this note is 56 minutes.   Overall, patient is  critically ill, prognosis is guarded.  Patient with Multiorgan failure and at high risk for cardiac arrest and death.    Lucie Leather, M.D.  [facility]  Medical Director [facility] Medical Director [facility] Cardio-Pulmonary Department

## 2020-06-13 ENCOUNTER — Inpatient Hospital Stay: Payer: Self-pay

## 2020-06-13 LAB — CBC WITH DIFFERENTIAL/PLATELET
Abs Immature Granulocytes: 0.04 10*3/uL (ref 0.00–0.07)
Basophils Absolute: 0 10*3/uL (ref 0.0–0.1)
Basophils Relative: 0 %
Eosinophils Absolute: 0.4 10*3/uL (ref 0.0–0.5)
Eosinophils Relative: 4 %
HCT: 30.2 % — ABNORMAL LOW (ref 39.0–52.0)
Hemoglobin: 9.7 g/dL — ABNORMAL LOW (ref 13.0–17.0)
Immature Granulocytes: 0 %
Lymphocytes Relative: 16 %
Lymphs Abs: 1.6 10*3/uL (ref 0.7–4.0)
MCH: 32.9 pg (ref 26.0–34.0)
MCHC: 32.1 g/dL (ref 30.0–36.0)
MCV: 102.4 fL — ABNORMAL HIGH (ref 80.0–100.0)
Monocytes Absolute: 1.8 10*3/uL — ABNORMAL HIGH (ref 0.1–1.0)
Monocytes Relative: 19 %
Neutro Abs: 5.9 10*3/uL (ref 1.7–7.7)
Neutrophils Relative %: 61 %
Platelets: 93 10*3/uL — ABNORMAL LOW (ref 150–400)
RBC: 2.95 MIL/uL — ABNORMAL LOW (ref 4.22–5.81)
RDW: 18.2 % — ABNORMAL HIGH (ref 11.5–15.5)
WBC: 9.8 10*3/uL (ref 4.0–10.5)
nRBC: 0 % (ref 0.0–0.2)

## 2020-06-13 LAB — COMPREHENSIVE METABOLIC PANEL
ALT: 24 U/L (ref 0–44)
AST: 75 U/L — ABNORMAL HIGH (ref 15–41)
Albumin: 1.7 g/dL — ABNORMAL LOW (ref 3.5–5.0)
Alkaline Phosphatase: 89 U/L (ref 38–126)
Anion gap: 8 (ref 5–15)
BUN: 29 mg/dL — ABNORMAL HIGH (ref 6–20)
CO2: 19 mmol/L — ABNORMAL LOW (ref 22–32)
Calcium: 8 mg/dL — ABNORMAL LOW (ref 8.9–10.3)
Chloride: 107 mmol/L (ref 98–111)
Creatinine, Ser: 2.67 mg/dL — ABNORMAL HIGH (ref 0.61–1.24)
GFR, Estimated: 29 mL/min — ABNORMAL LOW (ref >60)
Glucose, Bld: 101 mg/dL — ABNORMAL HIGH (ref 70–99)
Potassium: 4.5 mmol/L (ref 3.5–5.1)
Sodium: 134 mmol/L — ABNORMAL LOW (ref 135–145)
Total Bilirubin: 4 mg/dL — ABNORMAL HIGH (ref 0.3–1.2)
Total Protein: 6.7 g/dL (ref 6.5–8.1)

## 2020-06-13 LAB — GLUCOSE, CAPILLARY
Glucose-Capillary: 85 mg/dL (ref 70–99)
Glucose-Capillary: 109 mg/dL — ABNORMAL HIGH (ref 70–99)
Glucose-Capillary: 98 mg/dL (ref 70–99)

## 2020-06-13 LAB — MAGNESIUM: Magnesium: 1.8 mg/dL (ref 1.7–2.4)

## 2020-06-13 LAB — PHOSPHORUS: Phosphorus: 5.6 mg/dL — ABNORMAL HIGH (ref 2.5–4.6)

## 2020-06-13 LAB — TRIGLYCERIDES: Triglycerides: 66 mg/dL (ref <150)

## 2020-06-13 MED ORDER — LORAZEPAM 2 MG/ML IJ SOLN
1.0000 mg | INTRAMUSCULAR | Status: DC | PRN
  Administered 2020-06-13 (×2): 2 mg via INTRAVENOUS
  Filled 2020-06-13 (×3): qty 1

## 2020-06-13 MED ORDER — HALOPERIDOL LACTATE 5 MG/ML IJ SOLN
0.5000 mg | INTRAMUSCULAR | Status: DC | PRN
  Administered 2020-06-13: 0.5 mg via INTRAVENOUS
  Filled 2020-06-13: qty 1

## 2020-06-13 MED ORDER — ONDANSETRON HCL 4 MG/2ML IJ SOLN: 4.0000 mg | Freq: Four times a day (QID) | INTRAMUSCULAR | Status: DC | PRN

## 2020-06-13 MED ORDER — GLYCOPYRROLATE 0.2 MG/ML IJ SOLN
0.2000 mg | INTRAMUSCULAR | Status: DC | PRN
  Administered 2020-06-13: 0.2 mg via INTRAVENOUS
  Filled 2020-06-13 (×2): qty 1

## 2020-06-13 MED ORDER — GLYCOPYRROLATE 0.2 MG/ML IJ SOLN: 0.2000 mg | INTRAMUSCULAR | Status: DC | PRN

## 2020-06-13 MED ORDER — SODIUM CHLORIDE 0.9% FLUSH
3.0000 mL | Freq: Two times a day (BID) | INTRAVENOUS | Status: DC
  Administered 2020-06-13 (×2): 3 mL via INTRAVENOUS

## 2020-06-13 MED ORDER — HALOPERIDOL LACTATE 2 MG/ML PO CONC
0.5000 mg | ORAL | Status: DC | PRN
  Filled 2020-06-13: qty 0.3

## 2020-06-13 MED ORDER — LORAZEPAM 2 MG/ML IJ SOLN: 1.0000 mg | INTRAMUSCULAR | Status: DC | PRN

## 2020-06-13 MED ORDER — MORPHINE 100MG IN NS 100ML (1MG/ML) PREMIX INFUSION
2.0000 mg/h | INTRAVENOUS | Status: DC
  Administered 2020-06-13: 10 mg/h via INTRAVENOUS
  Administered 2020-06-13 – 2020-06-14 (×2): 12 mg/h via INTRAVENOUS
  Filled 2020-06-13 (×3): qty 100

## 2020-06-13 MED ORDER — LORAZEPAM 2 MG/ML PO CONC: 1.0000 mg | ORAL | Status: DC | PRN

## 2020-06-13 MED ORDER — ONDANSETRON 4 MG PO TBDP
4.0000 mg | ORAL_TABLET | Freq: Four times a day (QID) | ORAL | Status: DC | PRN
  Filled 2020-06-13: qty 1

## 2020-06-13 MED ORDER — BIOTENE DRY MOUTH MT LIQD: 15.0000 mL | OROMUCOSAL | Status: DC | PRN

## 2020-06-13 MED ORDER — FENTANYL 2500MCG IN NS 250ML (10MCG/ML) PREMIX INFUSION
0.0000 ug/h | INTRAVENOUS | Status: DC
  Administered 2020-06-13: 200 ug/h via INTRAVENOUS
  Administered 2020-06-13: 300 ug/h via INTRAVENOUS
  Filled 2020-06-13: qty 250

## 2020-06-13 MED ORDER — LORAZEPAM 1 MG PO TABS: 1.0000 mg | ORAL_TABLET | ORAL | Status: DC | PRN

## 2020-06-13 MED ORDER — LORAZEPAM 2 MG/ML IJ SOLN
2.0000 mg | INTRAMUSCULAR | Status: DC | PRN
  Administered 2020-06-13 – 2020-06-14 (×4): 2 mg via INTRAVENOUS
  Filled 2020-06-13 (×2): qty 1

## 2020-06-13 MED ORDER — GLYCOPYRROLATE 1 MG PO TABS
1.0000 mg | ORAL_TABLET | ORAL | Status: DC | PRN
  Filled 2020-06-13: qty 1

## 2020-06-13 MED ORDER — ACETAMINOPHEN 650 MG RE SUPP: 650.0000 mg | Freq: Four times a day (QID) | RECTAL | Status: DC | PRN

## 2020-06-13 MED ORDER — LORAZEPAM BOLUS VIA INFUSION
2.0000 mg | INTRAVENOUS | Status: DC | PRN
  Filled 2020-06-13: qty 2

## 2020-06-13 MED ORDER — LORAZEPAM 2 MG/ML IJ SOLN
2.0000 mg | Freq: Four times a day (QID) | INTRAMUSCULAR | Status: DC | PRN
  Filled 2020-06-13: qty 1

## 2020-06-13 MED ORDER — ACETAMINOPHEN 325 MG PO TABS: 650.0000 mg | ORAL_TABLET | Freq: Four times a day (QID) | ORAL | Status: DC | PRN

## 2020-06-13 MED ORDER — POLYVINYL ALCOHOL 1.4 % OP SOLN
1.0000 [drp] | Freq: Four times a day (QID) | OPHTHALMIC | Status: DC | PRN
  Filled 2020-06-13: qty 15

## 2020-06-13 MED ORDER — SODIUM CHLORIDE 0.9% FLUSH: 3.0000 mL | INTRAVENOUS | Status: DC | PRN

## 2020-06-13 MED ORDER — HALOPERIDOL 0.5 MG PO TABS: 0.5000 mg | ORAL_TABLET | ORAL | Status: DC | PRN

## 2020-06-13 NOTE — TOC Progression Note (Signed)
Transition of Care [facility]) - Progression Note    Patient Details  Name: Daniel Walton MRN: [id number] Date of Birth: [birth year]  Transition of Care [facility]) CM/SW Contact  Marina Goodell Phone Number: [phone] 06/13/2020, 12:00 PM  Clinical Narrative:     Patient currently intubated/sedated, plan for one-way extubation and comfort care. Patient's son Lenox Bink. HCPOA.       Expected Discharge Plan and Services                                                 Social Determinants of Health (SDOH) Interventions    Readmission Risk Interventions No flowsheet data found.

## 2020-06-13 NOTE — Progress Notes (Signed)
Son, who is the POA, has refused the patient's 1000 scheduled medications due to plan of transitioning to comfort care.

## 2020-06-13 NOTE — Progress Notes (Addendum)
                                                                                                                                                                                                         Daily Progress Note   Patient Name: Daniel Walton       Date: 06/13/2020 DOB: 10/17/1970  Age: 49 y.o. MRN#: [id number] Attending Physician: Kasa, Kurian, MD Primary Care Physician: [place] Primary Care Admit Date: 06/15/2020  Reason for Consultation/Follow-up: Establishing goals of care  Subjective: Patient is resting in bed on ventilator. Patient's son and patient's brother are at bedside. HPOA papers are in chart listing son who is present as HPOA. They state the family has been able to visit and they are ready to shift to comfort care. RN present during conversation. Details of comfort care discussed. Family does not want him to suffer and are concerned about his combativeness when he is has been awake in the past. Fentanyl drip with bolus orders already in place as patient is intubated, RN to titrate as needed for comfort. Will hold Propofol drip at extubation. Ativan for anxiety ordered. Haldol for agitation/delerium.   I completed a MOST form today with patient's son who is HPOA, and the signed original was placed in the chart. A photocopy was placed in the chart to be scanned into EMR. The patient outlined their wishes for the following treatment decisions:  Cardiopulmonary Resuscitation: Do Not Attempt Resuscitation (DNR/No CPR)  Medical Interventions: Comfort Measures: Keep clean, warm, and dry. Use medication by any route, positioning, wound care, and other measures to relieve pain and suffering. Use oxygen, suction and manual treatment of airway obstruction as needed for comfort. Do not transfer to the hospital unless comfort needs cannot be met in current location.  Antibiotics: No antibiotics (use other measures to relieve symptoms)  IV Fluids: No IV fluids (provide  other measures to ensure comfort)  Feeding Tube: No feeding tube        ADDENDUM 2:50pm: Patient resting in bed comfortably.     Length of Stay: 6  Current Medications: Scheduled Meds:  . chlordiazePOXIDE  25 mg Per Tube QID  . chlorhexidine gluconate (MEDLINE KIT)  15 mL Mouth Rinse BID  . Chlorhexidine Gluconate Cloth  6 each Topical Daily  . docusate  100 mg Per Tube BID  . enoxaparin (LOVENOX) injection  0.5 mg/kg Subcutaneous Q24H  . feeding supplement (PROSource TF)  90 mL Per Tube QID  . feeding supplement (VITAL AF 1.2 CAL)  1,000 mL Per Tube Q24H  .   folic acid  1 mg Per Tube Daily  . lactulose  30 g Per Tube TID  . mouth rinse  15 mL Mouth Rinse 10 times per day  . multivitamin with minerals  1 tablet Per Tube Daily  . pantoprazole sodium  40 mg Per Tube QHS  . polyethylene glycol  17 g Per Tube Daily  . sodium chloride flush  3 mL Intravenous Q12H  . thiamine  100 mg Per Tube Daily    Continuous Infusions: . sodium chloride Stopped (06/08/20 1037)  . sodium chloride    . dextrose 5 % with KCl 20 mEq / L 50 mL/hr at 06/13/20 1100  . DOPamine 10 mcg/kg/min (06/13/20 0900)  . fentaNYL infusion INTRAVENOUS 100 mcg/hr (06/13/20 1212)  . fentaNYL infusion INTRAVENOUS    . norepinephrine (LEVOPHED) Adult infusion Stopped (06/13/20 0724)  . propofol (DIPRIVAN) infusion Stopped (06/13/20 1210)    PRN Meds: sodium chloride, acetaminophen **OR** acetaminophen, antiseptic oral rinse, atropine, fentaNYL, glycopyrrolate **OR** [DISCONTINUED] glycopyrrolate **OR** glycopyrrolate, [DISCONTINUED] haloperidol **OR** haloperidol **OR** haloperidol lactate, [DISCONTINUED] LORazepam **OR** LORazepam **OR** LORazepam, ondansetron **OR** ondansetron (ZOFRAN) IV, polyvinyl alcohol, sodium chloride flush  Physical Exam Constitutional:      Comments: Eyes closed. On ventilator.   Skin:    General: Skin is warm and dry.             Vital Signs: BP 114/66   Pulse 99   Temp (!)  97.3 F (36.3 C)   Resp 12   Ht 5' 7.01" (1.702 m)   Wt 95.2 kg   SpO2 100%   BMI 32.86 kg/m  SpO2: SpO2: 100 % O2 Device: O2 Device: Ventilator O2 Flow Rate:    Intake/output summary:   Intake/Output Summary (Last 24 hours) at 06/13/2020 1222 Last data filed at 06/13/2020 1200 Gross per 24 hour  Intake 2066.59 ml  Output 29 ml  Net 2037.59 ml   LBM: Last BM Date: 06/16/2020 Baseline Weight: Weight: 97.7 kg Most recent weight: Weight: 95.2 kg       Palliative Assessment/Data:      Patient Active Problem List   Diagnosis Date Noted  . Hepatic encephalopathy (H[facility]) 06/04/2020  . Alcoholic cirrhosis of liver with ascites ([facility]) 06/17/2020  . Alcohol dependence ([facility]) 06/24/2020  . Seizures ([facility]) 11/18/2018    Palliative Care Assessment & Plan    Recommendations/Plan:  Shifting to comfort care. Resting comfrotably.     Code Status:    Code Status Orders  (From admission, onward)         Start     Ordered   06/13/20 1218  Do not attempt resuscitation (DNR)  Continuous       Question Answer Comment  In the event of cardiac or respiratory ARREST Do not call a "code blue"   In the event of cardiac or respiratory ARREST Do not perform Intubation, CPR, defibrillation or ACLS   In the event of cardiac or respiratory ARREST Use medication by any route, position, wound care, and other measures to relive pain and suffering. May use oxygen, suction and manual treatment of airway obstruction as needed for comfort.   Comments MOST form in chart.      06/13/20 1219        Code Status History    Date Active Date Inactive Code Status Order ID Comments User Context   06/11/2020 1756 06/13/2020 1219 DNR [id number]  Kasa, Kurian, MD Inpatient   06/15/2020 0943 06/11/2020 1756 Full Code [id number]  Agbata, Tochukwu,   MD ED   11/18/2018 1115 11/19/2018 1608 Full Code [id number]  Nicholes Mango, MD ED   Advance Care Planning Activity       Prognosis:   Hours -  Days    Care plan was discussed with RN   Thank you for allowing the Palliative Medicine Team to assist in the care of this patient.   Total Time 35 min Prolonged Time Billed  no      Greater than 50%  of this time was spent counseling and coordinating care related to the above assessment and plan.  Asencion Gowda, NP  Please contact Palliative Medicine Team phone at [phone] for questions and concerns.

## 2020-06-13 NOTE — Progress Notes (Signed)
Patient transitioning to comfort care. Continuous feeds discontinued. Extubated by respiratory therapist. Patient given ativan and robinol for comfort. Family at the bedside.

## 2020-06-13 NOTE — Progress Notes (Signed)
Nutrition Brief Note  Chart reviewed. Pt now transitioning to comfort care.  No further nutrition interventions warranted at this time.  Please re-consult as needed.   Leydi Winstead MS, RD, LDN Please refer to AMION for RD and/or RD on-call/weekend/after hours pager    

## 2020-06-13 NOTE — Progress Notes (Signed)
Comfort care and monitoring continues.  The patient is experiencing some agonal breathing but appears comfortable and not in any distress.  PRN medications given in addition to Fentanyl infusion for comfort.  Will transition to Morphine drip this afternoon. Family decided to go home for now. Will call to check in and will come back tomorrow if the patient has not yet passed.   Transitioned to Morphine infusion prior to discharge. Foley remains in place.   Son notified of transfer. Report given to [facility]. CCMD notified of transfer and tele DC.

## 2020-06-13 NOTE — Procedures (Signed)
Extubation Procedure Note  Patient Details:   Name: Daniel Walton DOB: [birth year] MRN: [id number]   Airway Documentation:    Vent end date: 06/13/20 Vent end time: 1238   Evaluation  O2 sats: Comfort Care Complications: No apparent complications Patient did tolerate procedure well. Bilateral Breath Sounds: Clear, Diminished   No.  Extubated to comfort care per MD order.  Ronda Fairly Alisabeth Selkirk 06/13/2020, 1:09 PM

## 2020-06-13 NOTE — Progress Notes (Signed)
CRITICAL CARE NOTE 49 year old male with ETOH abuse and alcoholic liver cirrhosis admitted 11/12 for hepatic encephalopathy and possible alcohol withdrawal after non-compliance with home medications and ongoing ETOH abuse. On 11/15, became more agitated and combative despite ativan, librium, and haldol therefore transferred to the ICU and started on precedex.   History of present illness   HPI obtained from medical chart review as patient is encephalopathic.   49 year old male with prior history of ETOH abuse, alcoholic liver cirrhosis, SBP, esophageal varices, DM, ventral and inguinal hernia admitted on 11/12 to [facility] with hepatic encephalopathy.   Recently admitted at [facility] 10/19- 10/22 for decompensated cirrhosis and again on 10/29- 11/1 for hepatic encephalopathy and and decompensated cirrhosis. He was discharged home however was noncompliant with his medications and continued to drink alcohol. He presented back with worsening confusion and agitation. He admitted to [facility] for hepatic encephalopathy, possible alcohol withdrawal, and empircally started on SBP coverage. He was restarted on his home medications and CIWA with ativan. ETOH was negative on admit. Hospital course with confusion slowly improving, however course complicated by a fall out of bed on 11/14 with negative CT head. He continued on ativan CIWA, librium added 11/15, however he became more agitated and combative despite ativan, librium, and haldol. He was therefore transferred to the ICU and started on precedex infusion. He remains afebrile and hemodynamically stable otherwise. PCCM consulted for further ICU management.   Past Medical History  Alcoholic liver cirrhosis, ascites and SBP, esophageal varices (grade 1), DM, ETOH abuse, ventral and inguinal hernia  Significant Hospital Events   11/12 admitted 11/15 tx ICU for precedex 11/16 patient emergently intubated, [patient made DNR 11/17 severe resp failure   CC   follow up respiratory failure  SUBJECTIVE Patient remains critically ill Prognosis is guarded Liver failure, resp failure, severe brain injury from hepatic encephalopathy   BP 109/66   Pulse (!) 107   Temp 98.4 F (36.9 C)   Resp 12   Ht 5' 7.01" (1.702 m)   Wt 95.2 kg   SpO2 100%   BMI 32.86 kg/m    I/O last 3 completed shifts: In: 3934.6 [I.V.:2757.1; NG/GT:177; IV Piggyback:1000.5] Out: 1625 [Urine:1625] No intake/output data recorded.  SpO2: 100 % FiO2 (%): 40 %  Estimated body mass index is 32.86 kg/m as calculated from the following:   Height as of this encounter: 5' 7.01" (1.702 m).   Weight as of this encounter: 95.2 kg.  SIGNIFICANT EVENTS   REVIEW OF SYSTEMS  PATIENT IS UNABLE TO PROVIDE COMPLETE REVIEW OF SYSTEMS DUE TO SEVERE CRITICAL ILLNESS        PHYSICAL EXAMINATION:  GENERAL:critically ill appearing, +resp distress HEAD: Normocephalic, atraumatic.  EYES:+scleral icterus.  MOUTH: Moist mucosal membrane. NECK: Supple.  PULMONARY: +rhonchi, +wheezing CARDIOVASCULAR: S1 and S2. Regular rate and rhythm. No murmurs, rubs, or gallops.  GASTROINTESTINAL: Soft, nontender, -distended.  Positive bowel sounds.   MUSCULOSKELETAL: +edema.  NEUROLOGIC: obtunded, GCS<8 SKIN:intact,warm,dry  MEDICATIONS: I have reviewed all medications and confirmed regimen as documented   CULTURE RESULTS   Recent Results (from the past 240 hour(s))  Respiratory Panel by RT PCR (Flu A&B, Covid) - Nasopharyngeal Swab     Status: None   Collection Time: 06/10/2020  8:58 AM   Specimen: Nasopharyngeal Swab  Result Value Ref Range Status   SARS Coronavirus 2 by RT PCR NEGATIVE NEGATIVE Final    Comment: (NOTE) SARS-CoV-2 target nucleic acids are NOT DETECTED.  The SARS-CoV-2 RNA is  generally detectable in upper respiratoy specimens during the acute phase of infection. The lowest concentration of SARS-CoV-2 viral copies this assay can detect is 131 copies/mL. A  negative result does not preclude SARS-Cov-2 infection and should not be used as the sole basis for treatment or other patient management decisions. A negative result may occur with  improper specimen collection/handling, submission of specimen other than nasopharyngeal swab, presence of viral mutation(s) within the areas targeted by this assay, and inadequate number of viral copies (<131 copies/mL). A negative result must be combined with clinical observations, patient history, and epidemiological information. The expected result is Negative.  Fact Sheet for Patients:  https://www.moore.com/  Fact Sheet for Healthcare Providers:  https://www.young.biz/  This test is no t yet approved or cleared by the [place] FDA and  has been authorized for detection and/or diagnosis of SARS-CoV-2 by FDA under an Emergency Use Authorization (EUA). This EUA will remain  in effect (meaning this test can be used) for the duration of the COVID-19 declaration under Section 564(b)(1) of the Act, 21 U.S.C. section 360bbb-3(b)(1), unless the authorization is terminated or revoked sooner.     Influenza A by PCR NEGATIVE NEGATIVE Final   Influenza B by PCR NEGATIVE NEGATIVE Final    Comment: (NOTE) The Xpert Xpress SARS-CoV-2/FLU/RSV assay is intended as an aid in  the diagnosis of influenza from Nasopharyngeal swab specimens and  should not be used as a sole basis for treatment. Nasal washings and  aspirates are unacceptable for Xpert Xpress SARS-CoV-2/FLU/RSV  testing.  Fact Sheet for Patients: https://www.moore.com/  Fact Sheet for Healthcare Providers: https://www.young.biz/  This test is not yet approved or cleared by the [place] FDA and  has been authorized for detection and/or diagnosis of SARS-CoV-2 by  FDA under an Emergency Use Authorization (EUA). This EUA will remain  in effect (meaning this test can  be used) for the duration of the  Covid-19 declaration under Section 564(b)(1) of the Act, 21  U.S.C. section 360bbb-3(b)(1), unless the authorization is  terminated or revoked. Performed at [facility], [address] [place], [place] [address]   MRSA PCR Screening     Status: None   Collection Time: 06/10/20  6:20 PM   Specimen: Nasal Mucosa; Nasopharyngeal  Result Value Ref Range Status   MRSA by PCR NEGATIVE NEGATIVE Final    Comment:        The GeneXpert MRSA Assay (FDA approved for NASAL specimens only), is one component of a comprehensive MRSA colonization surveillance program. It is not intended to diagnose MRSA infection nor to guide or monitor treatment for MRSA infections. Performed at [facility], [address] [place], [place] [address]           Nutrition Status: Nutrition Problem: Inadequate oral intake Etiology: inability to eat Signs/Symptoms: NPO status Interventions: Tube feeding, Prostat, MVI     Indwelling Urinary Catheter continued, requirement due to   Reason to continue Indwelling Urinary Catheter strict Intake/Output monitoring for hemodynamic instability         Ventilator continued, requirement due to severe respiratory failure   Ventilator Sedation RASS 0 to -2      ASSESSMENT AND PLAN SYNOPSIS  PROGRESSIVE LIVER FAILURE LEADING TO HEPATIC ENCEPHALOPATHY LEADING TO SEVERE HYPOXIA RESP FAILURE, PATIENT WITH POOR QUALITY OF LIFE   Severe ACUTE Hypoxic and Hypercapnic Respiratory Failure -continue Full MV support -continue Bronchodilator Therapy -Wean Fio2 and PEEP as tolerated -VAP/VENT bundle implementation   ACUTE KIDNEY INJURY/Renal Failure -  continue Foley Catheter-assess need -Avoid nephrotoxic agents -Follow urine output, BMP -Ensure adequate renal perfusion, optimize oxygenation -Renal dose medications     NEUROLOGY Acute toxic metabolic encephalopathy, need for sedation Goal RASS -2 to  -3  CARDIAC ICU monitoring  ID -continue IV abx as prescibed -follow up cultures  GI GI PROPHYLAXIS as indicated  NUTRITIONAL STATUS Nutrition Status: Nutrition Problem: Inadequate oral intake Etiology: inability to eat Signs/Symptoms: NPO status Interventions: Tube feeding, Prostat, MVI   DIET-->TF's as tolerated Constipation protocol as indicated  ENDO - will use ICU hypoglycemic\Hyperglycemia protocol if indicated     ELECTROLYTES -follow labs as needed -replace as needed -pharmacy consultation and following   DVT/GI PRX ordered and assessed TRANSFUSIONS AS NEEDED MONITOR FSBS I Assessed the need for Labs I Assessed the need for Foley I Assessed the need for Central Venous Line Family Discussion when available I Assessed the need for Mobilization I made an Assessment of medications to be adjusted accordingly Safety Risk assessment completed   CASE DISCUSSED IN MULTIDISCIPLINARY ROUNDS WITH ICU TEAM  Critical Care Time devoted to patient care services described in this note is 54 minutes.   Overall, patient is critically ill, prognosis is guarded.  Patient with Multiorgan failure and at high risk for cardiac arrest and death.   Patient is DNR, plan for comfort care measures  Logyn Kendrick Santiago Glad, M.D.  [facility]  Medical Director [facility] Medical Director [facility] Cardio-Pulmonary Department

## 2020-06-14 DIAGNOSIS — Z66 Do not resuscitate: Secondary | ICD-10-CM

## 2020-06-26 NOTE — Death Summary Note (Signed)
Death Summary  Daniel Walton ZDG:[id number] DOB: 08/14/1970 DOA: 07-03-2020  PCP: [facility] PCP/Office notified:   Admit date: 2020/07/03 Date of Death: July 10, 2020  Final Diagnoses:  Principal Problem:   Hepatic encephalopathy (HCC) Active Problems:   Alcoholic cirrhosis of liver with ascites (HCC)   Alcohol dependence (HCC)    1. Severe hypoxemic respiratory failure as a result of 2. Severe hepatic encephalopathy as a result of 3. Alcoholic cirrhosis with ascites as a result of 4. Chronic alcohol dependence   History of present illness & Hospital Course:  By Dr. Belia Heman on 11/18: "49 year old male with prior history of ETOH abuse, alcoholic liver cirrhosis, SBP, esophageal varices, DM, ventral and inguinal hernia admitted on 04-Jul-2023 to [facility] with hepatic encephalopathy.  Recently admitted at [facility] 10/19- 10/22 for decompensated cirrhosis and again on 10/29- 11/1 for hepatic encephalopathy and and decompensated cirrhosis. He was discharged home however was noncompliant with his medications and continued to drink alcohol. He presented back with worsening confusion and agitation. He admitted to [facility] for hepatic encephalopathy, possible alcohol withdrawal, and empircally started on SBP coverage. He was restarted on his home medications and CIWA with ativan. ETOH was negative on admit.   Hospital course with confusion slowly improving, however course complicated by a fall out of bed on 11/14 with negative CT head. He continued on ativan CIWA, librium added 11/15, however he became more agitated and combative despite ativan, librium, and haldol. He was therefore transferred to the ICU and started on precedex infusion. He remains afebrile and hemodynamically stable otherwise. PCCM consulted for further ICU management. "  While in ICU, patient required intubation and mechanical ventilation, and developed progressive multiorgan failure despite all aggressive medical  interventions.  Palliative care was consulted on 11/16.  Patient's son is HPOA, and after discussions with palliative care and family members, decision was made to pursue comfort care measures.  Patient was terminally extubated and placed on morphine infusion.     I assumed care of patient this AM, on comfort care with morphine drip.  Seen on rounds with fiance at bedside.  Patient appeared in no pain or distress with hypopnea and agonal breaths about every 10 seconds.  Informed by patient's nurse this afternoon that patient had passed at 1425.     Time: 1425  Signed:  Pennie Banter, DO  [facility] 07/10/20, 2:34 PM

## 2020-06-26 NOTE — Plan of Care (Signed)
  Problem: Clinical Measurements: Goal: Will remain free from infection Outcome: Progressing   Problem: Coping: Goal: Level of anxiety will decrease Outcome: Progressing   Problem: Pain Managment: Goal: General experience of comfort will improve Outcome: Progressing   Problem: Safety: Goal: Ability to remain free from injury will improve Outcome: Progressing    Pt resting comfortably through the night. Morphine drip continues at 12mg /hr. Family at bedside for most of night. Will be back tomorrow. Please call pt's son with any changes.

## 2020-06-26 NOTE — Progress Notes (Signed)
Daily Progress Note   Patient Name: Daniel Walton       Date: 06/05/2020 DOB: Aug 21, 1970  Age: 49 y.o. MRN#: [id number] Attending Physician: Pennie Banter, DO Primary Care Physician: [facility] Admit Date: 06/11/20  Reason for Consultation/Follow-up: Establishing goals of care  Subjective: Patient unresponsive, fiance at bedside  Length of Stay: 7  Current Medications: Scheduled Meds:  . sodium chloride flush  3 mL Intravenous Q12H    Continuous Infusions: . sodium chloride    . morphine 12 mg/hr (06/15/2020 0719)    PRN Meds: glycopyrrolate **OR** [DISCONTINUED] glycopyrrolate **OR** glycopyrrolate, [DISCONTINUED] haloperidol **OR** haloperidol **OR** haloperidol lactate, LORazepam, LORazepam, polyvinyl alcohol, sodium chloride flush  Physical Exam Constitutional:      Comments: unresponsive  Cardiovascular:     Rate and Rhythm: Tachycardia present.  Pulmonary:     Comments: agonal Skin:    General: Skin is warm and dry.             Vital Signs: BP (!) 73/36 (BP Location: Left Arm)   Pulse (!) 110   Temp 99.6 F (37.6 C) (Oral)   Resp (!) 5   Ht 5' 7.01" (1.702 m)   Wt 95.2 kg   SpO2 (!) 87%   BMI 32.86 kg/m  SpO2: SpO2: (!) 87 % O2 Device: O2 Device: Room Air O2 Flow Rate:    Intake/output summary:   Intake/Output Summary (Last 24 hours) at 06/23/2020 1436 Last data filed at 06/17/2020 0327 Gross per 24 hour  Intake 132.67 ml  Output --  Net 132.67 ml   LBM: Last BM Date: 11-Jun-2020 Baseline Weight: Weight: 97.7 kg Most recent weight: Weight: 95.2 kg       Palliative Assessment/Data: PPS 10%      Patient Active Problem List   Diagnosis Date Noted  . Hepatic encephalopathy (HCC) June 11, 2020  . Alcoholic cirrhosis of liver with  ascites (HCC) 06/11/20  . Alcohol dependence (HCC) 06/11/2020  . Seizures (HCC) 11/18/2018    Palliative Care Assessment & Plan   HPI: 49 year old male with ETOH abuse and alcoholic liver cirrhosis admitted 11/12 for hepatic encephalopathy and possible alcohol withdrawal after non-compliance with home medications and ongoing ETOH abuse.   Assessment: Remains on morphine infusion at 12 mg/hr.  Appears comfortable, no labored breathing. Family agrees. Educated family on expectations as patient moves towards end of life.  Family asks about secretions - not a current problem but was concerning last night. Discussed use of medication and repositioning patient. Discussed anticipating hospital death. Family not willing to accept risk of death during transport to hospice facility.   Recommendations/Plan:  Continue full comfort measures  Anticipate hospital death, no transfer to hospice facility  Goals of Care and Additional Recommendations:  Limitations on Scope of Treatment: Full Comfort Care  Code Status:  DNR  Prognosis:   Hours - Days  Discharge Planning:  Anticipated Hospital Death  Care plan was discussed with RN, patient's family  Thank you for allowing the Palliative Medicine Team to assist in the care of this patient.   Total Time 15 minutes Prolonged Time Billed  no       Greater than 50%  of this  time was spent counseling and coordinating care related to the above assessment and plan.  Juel Burrow, DNP, [facility] Palliative Medicine Team Team Phone # [phone]  Pager [phone]

## 2020-06-26 DEATH — deceased

## 2020-11-04 IMAGING — US RIGHT LOWER EXTREMITY VENOUS ULTRASOUND
1 series · 14 of 24 positions shown · non-contrast
Comparison: None.

CLINICAL DATA: Right lower extremity swelling

EXAM:
RIGHT LOWER EXTREMITY VENOUS DUPLEX ULTRASOUND
TECHNIQUE: Doppler venous assessment of the right lower extremity deep venous
system was performed, including characterization of spectral flow,
compressibility, and phasicity.

[Series 1: right lower extremity venous ultrasound · 0.10mm/px · 14 of 35 slices shown]
[im 1/35]
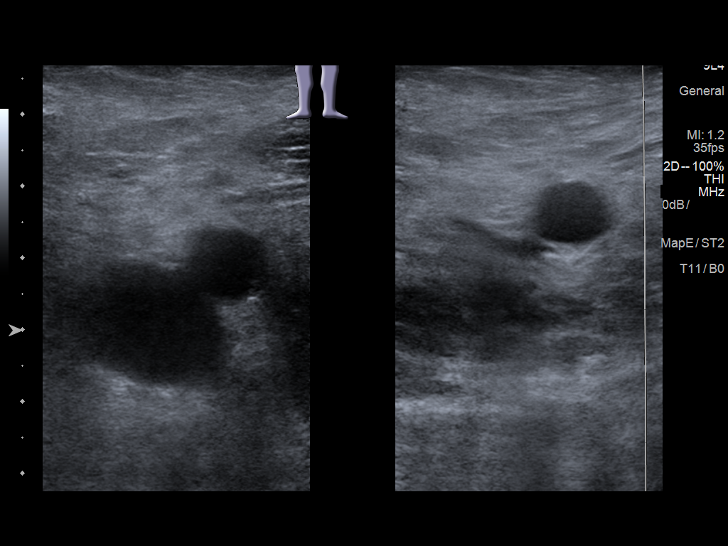
[im 3/35]
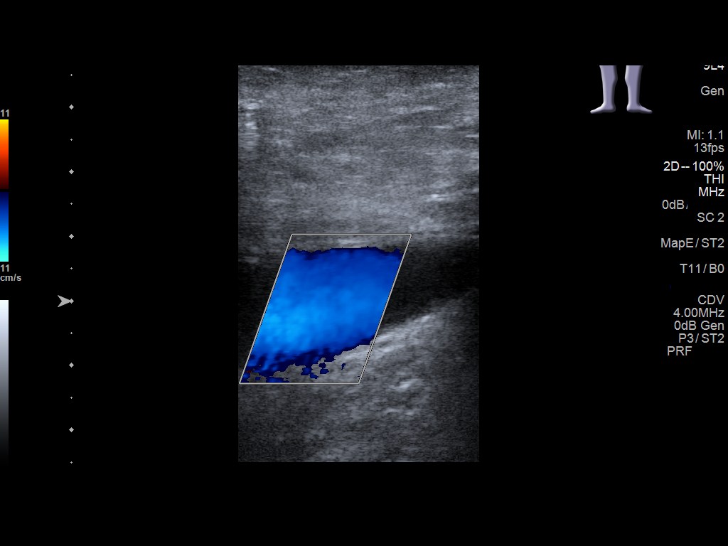
[im 6/35]
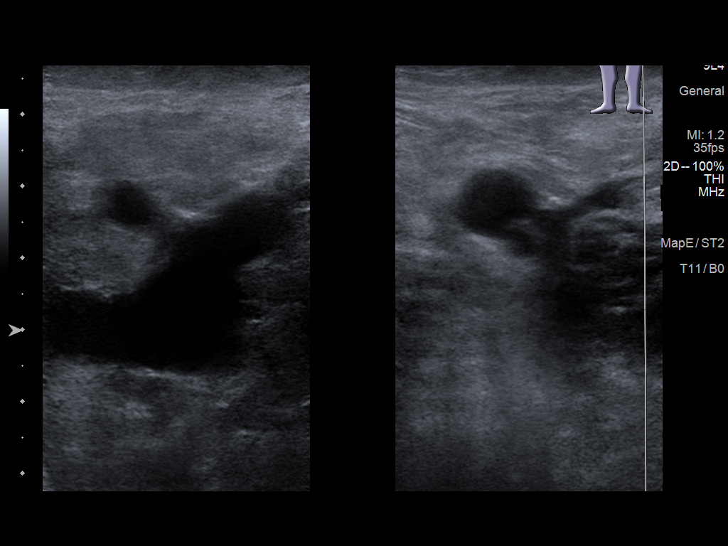
[im 9/35]
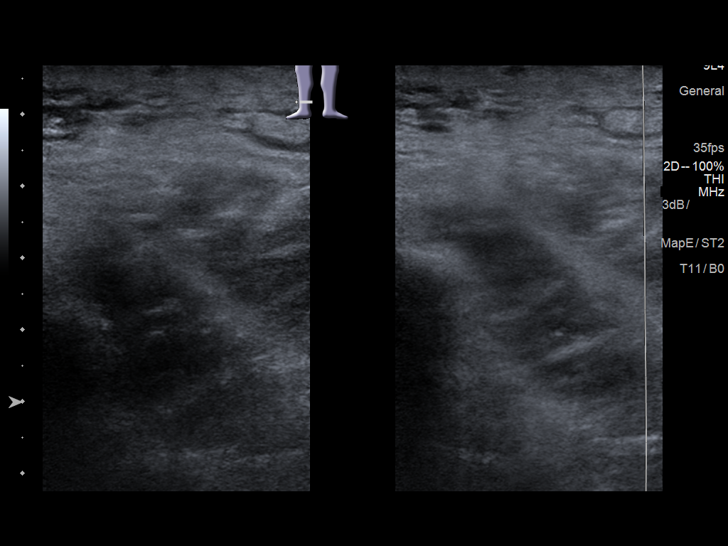
[im 11/35]
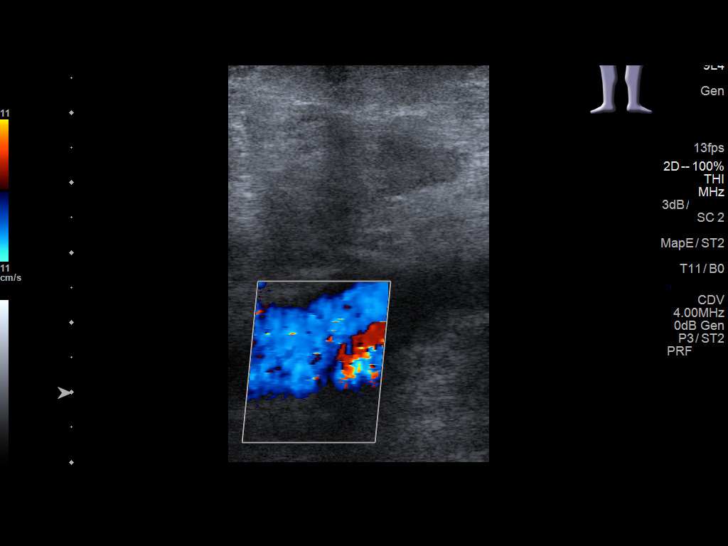
[im 14/35]
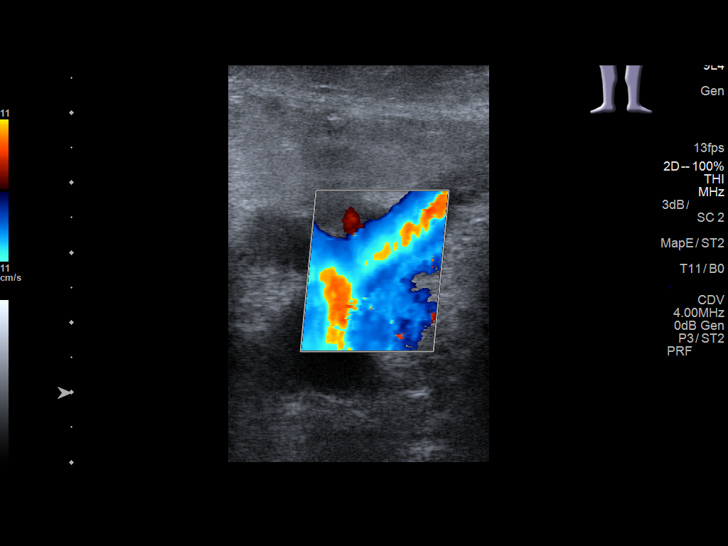
[im 17/35]
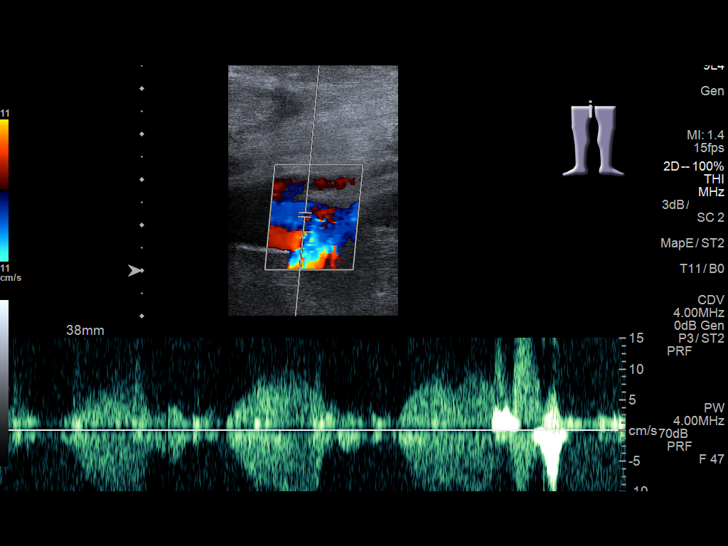
[im 18/35]
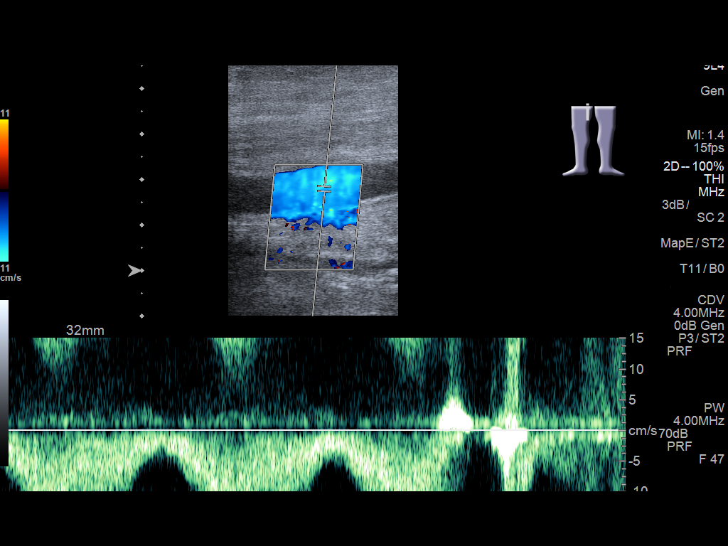
[im 21/35]
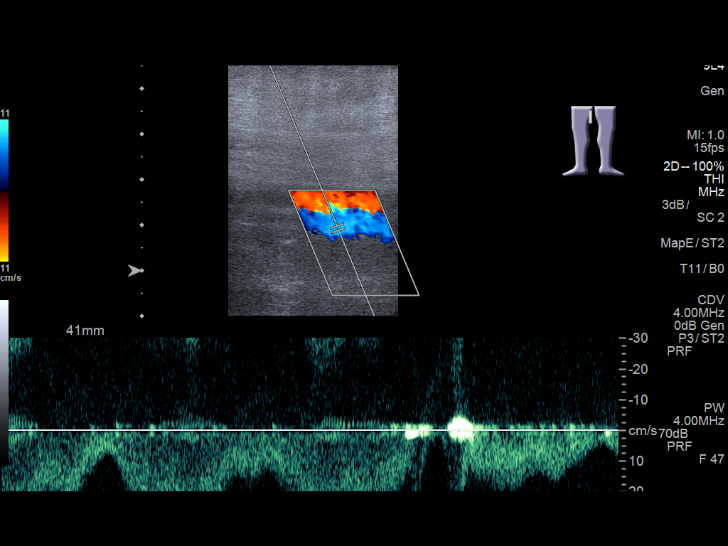
[im 24/35]
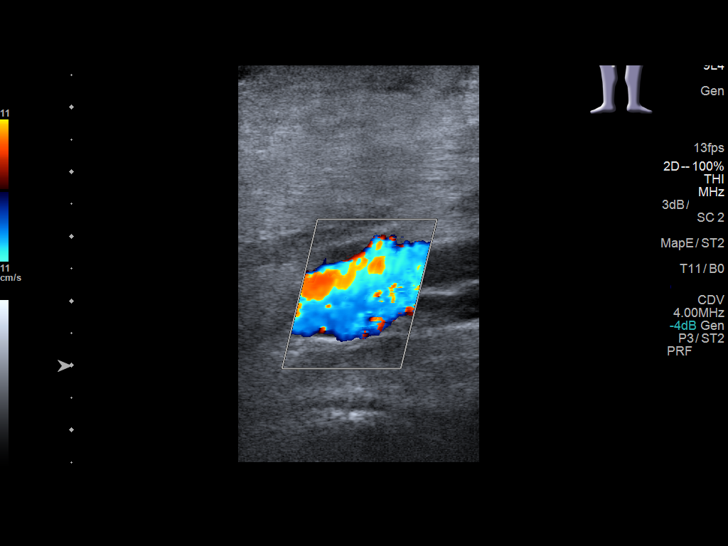
[im 27/35]
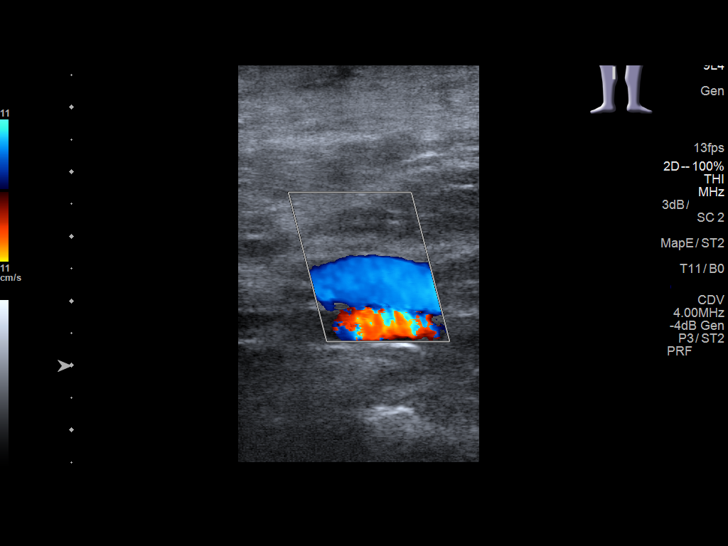
[im 29/35]
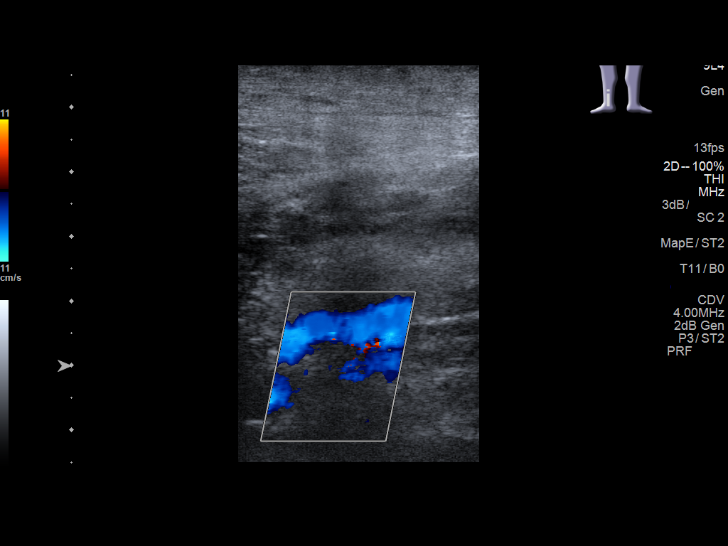
[im 32/35]
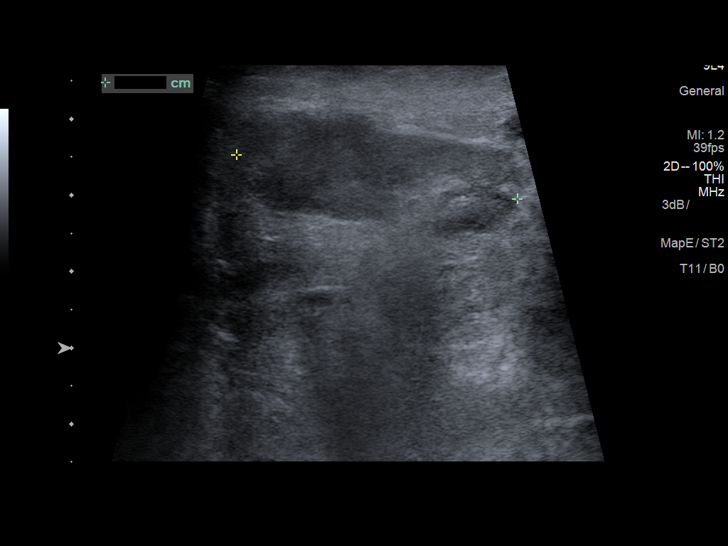
[im 35/35]
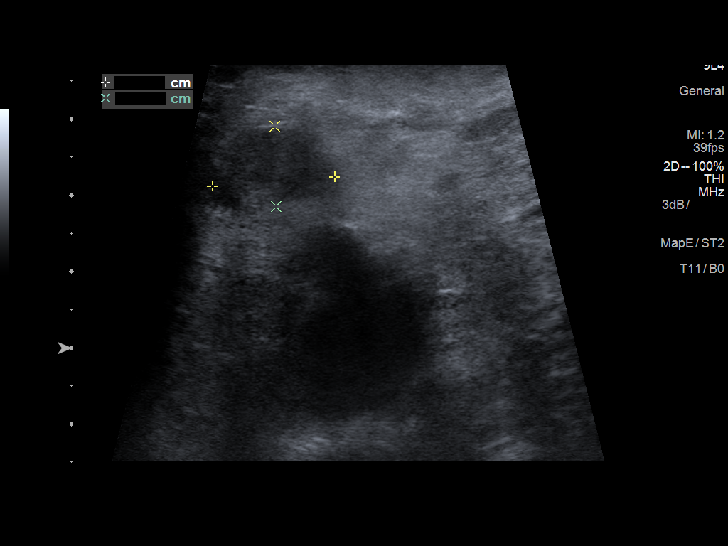

[14 of 24 positions shown; findings below may reference images not displayed]

FINDINGS: There is complete compressibility of the right common femoral,
femoral, and popliteal veins. Doppler analysis demonstrates
respiratory phasicity and augmentation of flow with calf
compression. No obvious superficial vein or calf vein thrombosis.
Mild right inguinal adenopathy is present. 1.1 cm short axis
diameter right inguinal lymph node is noted.
IMPRESSION: No evidence of right lower extremity DVT.

Mildly enlarged right inguinal lymph node is nonspecific. This may
be related to volume overload, an inflammatory process, or
malignancy. Correlate clinically as for the need for follow-up or
further imaging.

## 2021-03-03 IMAGING — DX DG CHEST 1V
1 series · 2 of 2 positions shown · non-contrast
Comparison: Chest x-ray 11/18/2018.

CLINICAL DATA: ET tube placement.

EXAM:
CHEST  1 VIEW

[Series 1: chest ap · 0.14mm/px · 2 of 2 slices shown]
[im 1/2]
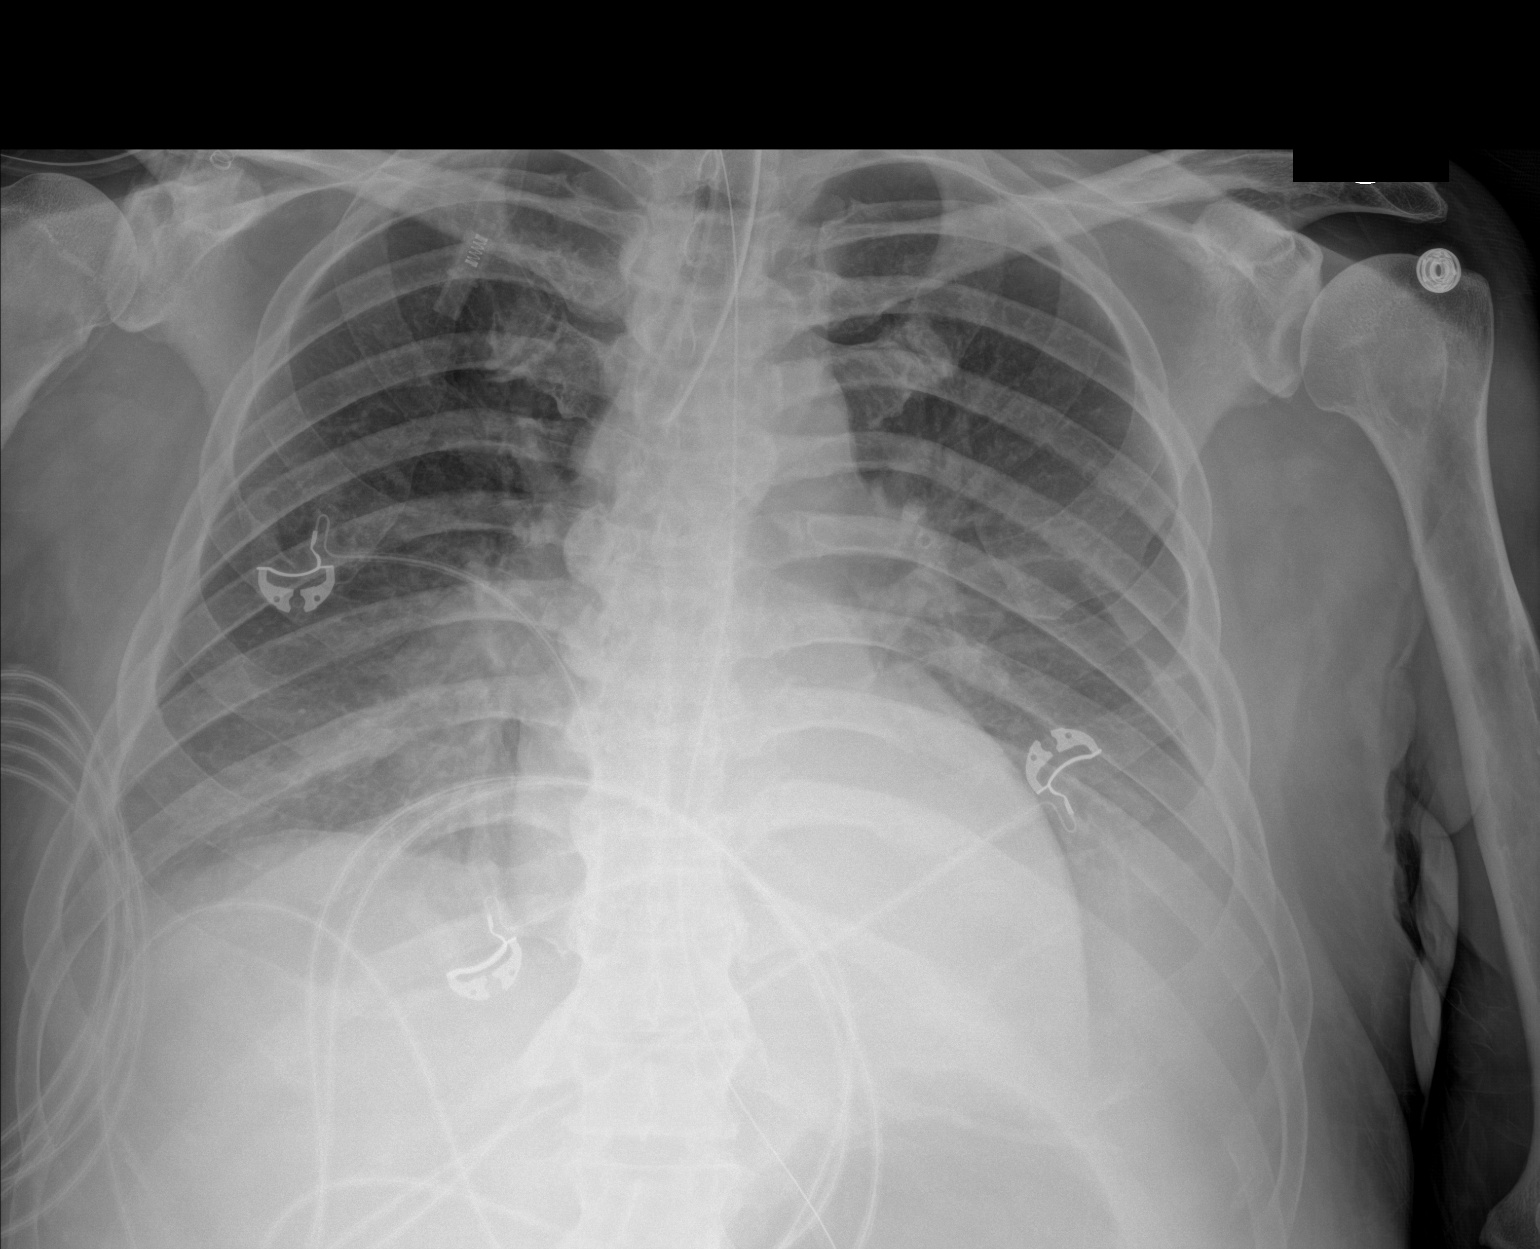
[im 2/2]
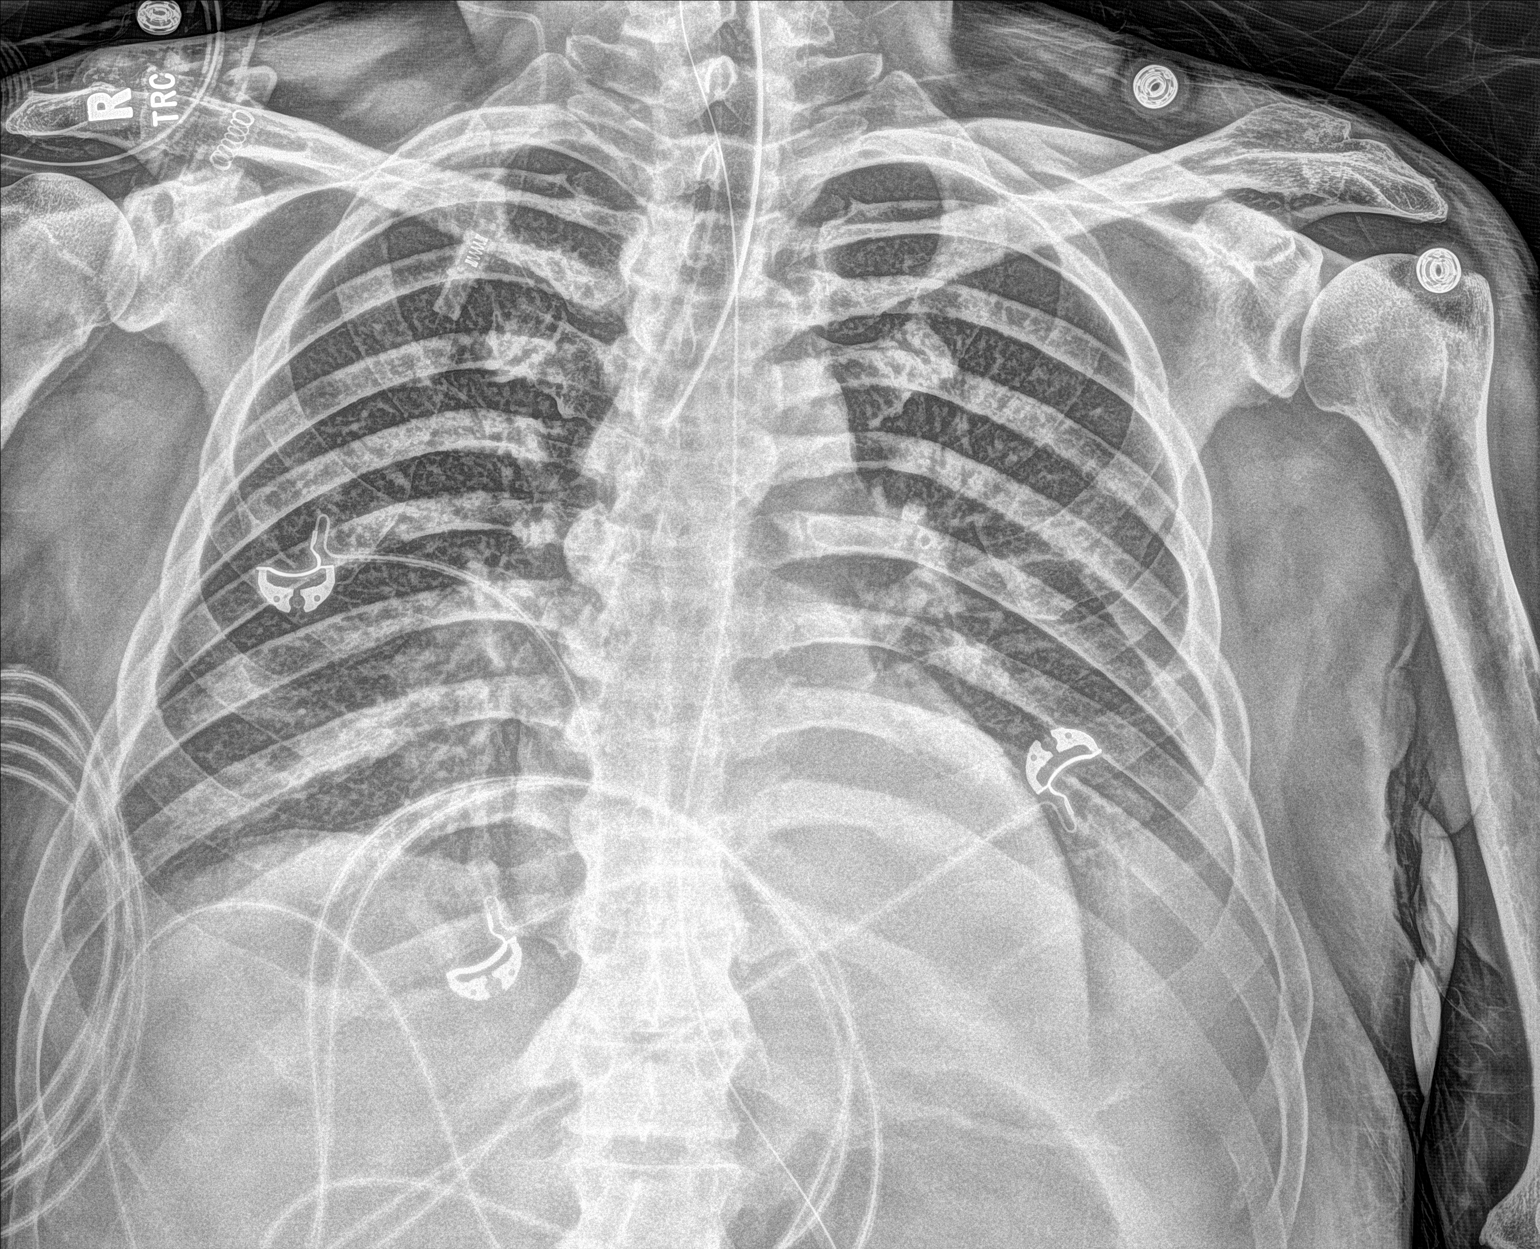

[2 of 2 positions shown; findings below may reference images not displayed]

FINDINGS: The endotracheal tube is 2.2 cm above the carina. There is an NG
tube coursing down the esophagus and into the stomach.

The cardiac silhouette, mediastinal and hilar contours are within
normal limits and stable. The lungs demonstrate mild central
vascular congestion. Suspect small effusions and bibasilar
atelectasis.
IMPRESSION: 1. Endotracheal tube 2.2 cm above the carina.
2. NG tube in good position.
3. Suspect small effusions and bibasilar atelectasis.
